# Patient Record
Sex: Male | Born: 1990 | Race: Black or African American | Hispanic: No | Marital: Single
Health system: Southern US, Community
[De-identification: ages and names within clinical notes are randomized; demographics above are authoritative.]

---
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
## 2017-11-17 ENCOUNTER — Emergency Department (HOSPITAL_COMMUNITY)
Admission: EM | Admit: 2017-11-17 | Discharge: 2017-11-17 | Disposition: A | Discharge status: Home / Self Care | Payer: Self-pay | Attending: Emergency Medicine | Admitting: Emergency Medicine

## 2017-11-17 ENCOUNTER — Encounter (HOSPITAL_COMMUNITY): Payer: Self-pay | Admitting: Emergency Medicine

## 2017-11-17 DIAGNOSIS — R1110 Vomiting, unspecified: Secondary | ICD-10-CM

## 2017-11-17 DIAGNOSIS — R197 Diarrhea, unspecified: Secondary | ICD-10-CM | POA: Insufficient documentation

## 2017-11-17 DIAGNOSIS — R112 Nausea with vomiting, unspecified: Secondary | ICD-10-CM | POA: Insufficient documentation

## 2017-11-17 LAB — CBC
HEMATOCRIT: 47.2 % (ref 39.0–52.0)
HEMOGLOBIN: 16.4 g/dL (ref 13.0–17.0)
MCH: 27.6 pg (ref 26.0–34.0)
MCHC: 34.7 g/dL (ref 30.0–36.0)
MCV: 79.3 fL (ref 78.0–100.0)
Platelets: 241 10*3/uL (ref 150–400)
RBC: 5.95 MIL/uL — ABNORMAL HIGH (ref 4.22–5.81)
RDW: 14.3 % (ref 11.5–15.5)
WBC: 12.6 10*3/uL — ABNORMAL HIGH (ref 4.0–10.5)

## 2017-11-17 LAB — URINALYSIS, ROUTINE W REFLEX MICROSCOPIC
BILIRUBIN URINE: NEGATIVE
GLUCOSE, UA: NEGATIVE mg/dL
HGB URINE DIPSTICK: NEGATIVE
Ketones, ur: 80 mg/dL — AB
Leukocytes, UA: NEGATIVE
Nitrite: NEGATIVE
PH: 5 (ref 5.0–8.0)
Protein, ur: NEGATIVE mg/dL
SPECIFIC GRAVITY, URINE: 1.021 (ref 1.005–1.030)

## 2017-11-17 LAB — COMPREHENSIVE METABOLIC PANEL
ALT: 23 U/L (ref 0–44)
ANION GAP: 13 (ref 5–15)
AST: 24 U/L (ref 15–41)
Albumin: 4.7 g/dL (ref 3.5–5.0)
Alkaline Phosphatase: 66 U/L (ref 38–126)
BUN: 11 mg/dL (ref 6–20)
CO2: 25 mmol/L (ref 22–32)
Calcium: 9.7 mg/dL (ref 8.9–10.3)
Chloride: 106 mmol/L (ref 98–111)
Creatinine, Ser: 0.93 mg/dL (ref 0.61–1.24)
GFR calc Af Amer: >60 mL/min (ref >60)
GFR calc non Af Amer: >60 mL/min (ref >60)
Glucose, Bld: 89 mg/dL (ref 70–99)
POTASSIUM: 3.4 mmol/L — AB (ref 3.5–5.1)
SODIUM: 144 mmol/L (ref 135–145)
Total Bilirubin: 1 mg/dL (ref 0.3–1.2)
Total Protein: 8.3 g/dL — ABNORMAL HIGH (ref 6.5–8.1)

## 2017-11-17 LAB — LIPASE, BLOOD: Lipase: 30 U/L (ref 11–51)

## 2017-11-17 MED ORDER — ONDANSETRON 4 MG PO TBDP
4.0000 mg | ORAL_TABLET | Freq: Once | ORAL | Status: AC
  Administered 2017-11-17: 4 mg via ORAL
  Filled 2017-11-17: qty 1

## 2017-11-17 MED ORDER — PROMETHAZINE HCL 25 MG/ML IJ SOLN
12.5000 mg | Freq: Once | INTRAMUSCULAR | Status: AC
  Administered 2017-11-17: 12.5 mg via INTRAVENOUS
  Filled 2017-11-17: qty 1

## 2017-11-17 MED ORDER — PROMETHAZINE HCL 25 MG/ML IJ SOLN
25.0000 mg | Freq: Once | INTRAMUSCULAR | Status: DC
  Filled 2017-11-17: qty 1

## 2017-11-17 MED ORDER — DICYCLOMINE HCL 10 MG PO CAPS
20.0000 mg | ORAL_CAPSULE | Freq: Once | ORAL | Status: AC
  Administered 2017-11-17: 20 mg via ORAL
  Filled 2017-11-17: qty 2

## 2017-11-17 MED ORDER — SODIUM CHLORIDE 0.9 % IV BOLUS
500.0000 mL | Freq: Once | INTRAVENOUS | Status: AC
  Administered 2017-11-17: 500 mL via INTRAVENOUS

## 2017-11-17 MED ORDER — SODIUM CHLORIDE 0.9 % IV BOLUS
1000.0000 mL | Freq: Once | INTRAVENOUS | Status: AC
  Administered 2017-11-17: 1000 mL via INTRAVENOUS

## 2017-11-17 MED ORDER — PROMETHAZINE HCL 25 MG/ML IJ SOLN: 12.5000 mg | Freq: Once | INTRAMUSCULAR | Status: DC

## 2017-11-17 MED ORDER — ONDANSETRON HCL 4 MG PO TABS: 4.0000 mg | ORAL_TABLET | Freq: Three times a day (TID) | ORAL | 0 refills | Status: DC | PRN

## 2017-11-17 NOTE — ED Triage Notes (Signed)
Pt c/o abd cramping started around 1300 with n/v. Reports ate left over Congohinese food today from last night.  Pt has 20g I left Forearm given Zofran 4mg  in route. EKG showed sinus brady. CBG 103 Vitals: 124/90, 56HR, 98% on RA, 16R.

## 2017-11-17 NOTE — Discharge Instructions (Signed)
You may take 1 dose of Zofran every 8 hours as needed for nausea and vomiting.  Please try to keep yourself hydrated over the next several days.   Please follow up with your primary doctor within the next 5-7 days.  If you do not have a primary care provider, information for a healthcare clinic has been provided for you to make arrangements for follow up care. Please return to the ER sooner if you have any new or worsening symptoms, or if you have any of the following symptoms:  Abdominal pain that does not go away.  You have a fever.  You keep throwing up (vomiting).  The pain is felt only in portions of the abdomen. Pain in the right side could possibly be appendicitis. In an adult, pain in the left lower portion of the abdomen could be colitis or diverticulitis.  You pass bloody or black tarry stools.  There is bright red blood in the stool.  The constipation stays for more than 4 days.  There is belly (abdominal) or rectal pain.  You do not seem to be getting better.  You have any questions or concerns.

## 2017-11-17 NOTE — ED Notes (Signed)
Bed: WLPT4 Expected date:  Expected time:  Means of arrival:  Comments: 

## 2017-11-17 NOTE — ED Provider Notes (Signed)
[facility]-EMERGENCY DEPT Provider Note   CSN: [id number] Arrival date & time: 11/17/17  1651     History   Chief Complaint Chief Complaint  Patient presents with  . Abdominal Cramping  . Emesis    HPI Seth Fuentes is a 27 y.o. male.  HPI  Patient Is a 27 year old male with no significant past medical history who presents emergency department today for evaluation of abdominal pain, nausea vomiting and diarrhea that began this afternoon.  Patient states that he had Cong[place]inese food last night and ate the leftovers this morning.  Shortly after eating this food he began to have abdominal pain to the periumbilical area that felt crampy in nature.  It was initially constant but after receiving Zofran with EMS pain came intermittent and improved.  Ports several episodes of vomiting and one episode of diarrhea.  No hematochezia or melena.  Reports that he thought he saw some specks of blood during his last episode of vomiting in the ED.  Also reports chills.  Denies any other symptoms.  Denies any sick contacts.  History reviewed. No pertinent past medical history.  There are no active problems to display for this patient.   History reviewed. No pertinent surgical history.      Home Medications    Prior to Admission medications   Medication Sig Start Date End Date Taking? Authorizing Provider  ondansetron (ZOFRAN) 4 MG tablet Take 1 tablet (4 mg total) by mouth every 8 (eight) hours as needed for nausea or vomiting. 11/17/17   Reonna Finlayson S, PA-C    Family History No family history on file.  Social History Social History   Tobacco Use  . Smoking status: Not on file  Substance Use Topics  . Alcohol use: Not on file  . Drug use: Not on file     Allergies   Patient has no known allergies.   Review of Systems Review of Systems  Constitutional: Positive for chills and fever.  HENT: Negative for ear pain and sore throat.   Eyes: Negative for pain  and visual disturbance.  Respiratory: Negative for cough and shortness of breath.   Cardiovascular: Negative for chest pain.  Gastrointestinal: Positive for abdominal pain, diarrhea, nausea and vomiting. Negative for blood in stool and constipation.  Genitourinary: Negative for dysuria, frequency, hematuria and urgency.  Musculoskeletal: Negative for back pain.  Skin: Negative for rash.  Neurological: Negative for headaches.  All other systems reviewed and are negative.  Physical Exam Updated Vital Signs BP (!) 108/54 (BP Location: Left Arm)   Pulse 80   Temp 98.2 F (36.8 C) (Oral)   Resp 16   SpO2 98%   Physical Exam  Constitutional: He appears well-developed.  Thin  HENT:  Head: Normocephalic and atraumatic.  Mouth/Throat: Oropharynx is clear and moist.  Eyes: Pupils are equal, round, and reactive to light. Conjunctivae and EOM are normal. No scleral icterus.  Neck: Neck supple.  Cardiovascular: Normal rate, regular rhythm, normal heart sounds and intact distal pulses.  No murmur heard. Pulmonary/Chest: Effort normal and breath sounds normal. No respiratory distress. He has no wheezes.  Abdominal: Soft. Bowel sounds are normal. He exhibits no distension. There is no tenderness. There is no guarding.  Musculoskeletal: He exhibits no edema.  Neurological: He is alert.  Skin: Skin is warm and dry.  Psychiatric: He has a normal mood and affect.  Nursing note and vitals reviewed.  ED Treatments / Results  Labs (all labs ordered are listed,  but only abnormal results are displayed) Labs Reviewed  COMPREHENSIVE METABOLIC PANEL - Abnormal; Notable for the following components:      Result Value   Potassium 3.4 (*)    Total Protein 8.3 (*)    All other components within normal limits  CBC - Abnormal; Notable for the following components:   WBC 12.6 (*)    RBC 5.95 (*)    All other components within normal limits  URINALYSIS, ROUTINE W REFLEX MICROSCOPIC - Abnormal; Notable  for the following components:   APPearance CLEAR (*)    Ketones, ur 80 (*)    All other components within normal limits  LIPASE, BLOOD    EKG None  Radiology No results found.  Procedures Procedures (including critical care time)  Medications Ordered in ED Medications  promethazine (PHENERGAN) injection 12.5 mg (12.5 mg Intravenous Given 11/17/17 1841)  sodium chloride 0.9 % bolus 1,000 mL (0 mLs Intravenous Stopped 11/17/17 2030)  dicyclomine (BENTYL) capsule 20 mg (20 mg Oral Given 11/17/17 1929)  sodium chloride 0.9 % bolus 500 mL (0 mLs Intravenous Stopped 11/17/17 2150)  ondansetron (ZOFRAN-ODT) disintegrating tablet 4 mg (4 mg Oral Given 11/17/17 2150)     Initial Impression / Assessment and Plan / ED Course  I have reviewed the triage vital signs and the nursing notes.  Pertinent labs & imaging results that were available during my care of the patient were reviewed by me and considered in my medical decision making (see chart for details).   Final Clinical Impressions(s) / ED Diagnoses   Final diagnoses:  Vomiting and diarrhea   Patient presented the ED today complaining of abdominal pain, nausea, vomiting, diarrhea that began earlier today.  He is afebrile here with normal vital signs.  Nontoxic and nonseptic appearing.  Exam is reassuring. Abdominal exam is benign without tenderness.  Has bowel sounds present in all 4 quadrants.    Recheck patient after Phenergan he states that his symptoms have improved.  He is now sleeping comfortably in bed.  Will give IV fluid bolus, Bentyl and reassess.  Will also obtain labs and UA.  Labs with a mild leukocytosis to 12.6.  Also has mild hypokalemia, but otherwise vitals are within normal limits.  Reevaluated patient after IV fluids, Bentyl and Phenergan.  He has had no further episodes of vomiting and states that his abdominal pain is somewhat improved after medications.  On reevaluation pain patient is sleeping comfortably in  bed.  Repeat abdominal exam is grossly benign without guarding or rebound tenderness.  After reevaluating patient, he had an additional episode of vomiting and diarrhea.  Will give additional Zofran and p.o. challenge the patient.  Reevaluated patient and he is sleeping comfortably in room in no acute distress.  Discussed the results of his lab work and urinalysis with him and his significant other at bedside.  Discussed the plan to discharge patient with symptomatic treatment and close follow-up with PCP advised to return to the ER for any new or worsening symptoms in the meantime.  Patient and significant other at bedside voiced understanding plan reasons return to the ED.  All questions answered  ED Discharge Orders         Ordered    ondansetron (ZOFRAN) 4 MG tablet  Every 8 hours PRN     11/17/17 2149           Karrie Meres, PA-C 11/17/17 2226    Mancel Bale, MD 11/18/17 0010

## 2017-11-20 ENCOUNTER — Encounter (HOSPITAL_COMMUNITY): Payer: Self-pay | Admitting: Emergency Medicine

## 2017-11-20 ENCOUNTER — Emergency Department (HOSPITAL_COMMUNITY)
Admission: EM | Admit: 2017-11-20 | Discharge: 2017-11-20 | Disposition: A | Discharge status: Home / Self Care | Payer: Self-pay | Attending: Emergency Medicine | Admitting: Emergency Medicine

## 2017-11-20 ENCOUNTER — Emergency Department (HOSPITAL_COMMUNITY): Payer: Self-pay

## 2017-11-20 ENCOUNTER — Other Ambulatory Visit: Payer: Self-pay

## 2017-11-20 DIAGNOSIS — F17210 Nicotine dependence, cigarettes, uncomplicated: Secondary | ICD-10-CM | POA: Insufficient documentation

## 2017-11-20 DIAGNOSIS — Z79899 Other long term (current) drug therapy: Secondary | ICD-10-CM | POA: Insufficient documentation

## 2017-11-20 DIAGNOSIS — K529 Noninfective gastroenteritis and colitis, unspecified: Secondary | ICD-10-CM | POA: Insufficient documentation

## 2017-11-20 LAB — CBC
HCT: 46.5 % (ref 39.0–52.0)
HEMOGLOBIN: 15.2 g/dL (ref 13.0–17.0)
MCH: 26.8 pg (ref 26.0–34.0)
MCHC: 32.7 g/dL (ref 30.0–36.0)
MCV: 81.9 fL (ref 78.0–100.0)
Platelets: 242 10*3/uL (ref 150–400)
RBC: 5.68 MIL/uL (ref 4.22–5.81)
RDW: 14.1 % (ref 11.5–15.5)
WBC: 7.9 10*3/uL (ref 4.0–10.5)

## 2017-11-20 LAB — COMPREHENSIVE METABOLIC PANEL
ALK PHOS: 54 U/L (ref 38–126)
ALT: 18 U/L (ref 0–44)
ANION GAP: 8 (ref 5–15)
AST: 17 U/L (ref 15–41)
Albumin: 4.2 g/dL (ref 3.5–5.0)
BILIRUBIN TOTAL: 0.6 mg/dL (ref 0.3–1.2)
BUN: 10 mg/dL (ref 6–20)
CALCIUM: 9.6 mg/dL (ref 8.9–10.3)
CO2: 26 mmol/L (ref 22–32)
Chloride: 106 mmol/L (ref 98–111)
Creatinine, Ser: 1.03 mg/dL (ref 0.61–1.24)
GFR calc non Af Amer: >60 mL/min (ref >60)
Glucose, Bld: 106 mg/dL — ABNORMAL HIGH (ref 70–99)
Potassium: 3.4 mmol/L — ABNORMAL LOW (ref 3.5–5.1)
SODIUM: 140 mmol/L (ref 135–145)
TOTAL PROTEIN: 7.5 g/dL (ref 6.5–8.1)

## 2017-11-20 LAB — LIPASE, BLOOD: Lipase: 68 U/L — ABNORMAL HIGH (ref 11–51)

## 2017-11-20 MED ORDER — IOPAMIDOL (ISOVUE-300) INJECTION 61%
INTRAVENOUS | Status: AC
  Administered 2017-11-20: 100 mL
  Filled 2017-11-20: qty 100

## 2017-11-20 MED ORDER — PROMETHAZINE HCL 25 MG PO TABS: 25.0000 mg | ORAL_TABLET | Freq: Four times a day (QID) | ORAL | 0 refills | Status: DC | PRN

## 2017-11-20 MED ORDER — DIPHENOXYLATE-ATROPINE 2.5-0.025 MG PO TABS: 2.0000 | ORAL_TABLET | Freq: Four times a day (QID) | ORAL | 0 refills | Status: DC | PRN

## 2017-11-20 MED ORDER — DICYCLOMINE HCL 20 MG PO TABS: 20.0000 mg | ORAL_TABLET | Freq: Two times a day (BID) | ORAL | 0 refills | Status: DC

## 2017-11-20 NOTE — ED Notes (Signed)
Pt called for room x1

## 2017-11-20 NOTE — ED Provider Notes (Signed)
[facility]ENC[facility] Provider Note   CSN: [id number] Arrival date & time: 11/20/17  0235     History   Chief Complaint Chief Complaint  Patient presents with  . Abdominal Pain    HPI Seth Fuentes is a 27 y.o. male.  Patient presents to the emergency department for evaluation of abdominal pain.  Patient was [facility] ER [facility] days ago for same.  Patient reports that he is continuing to have diffuse abdominal pain, nausea, vomiting and diarrhea.     History reviewed. No pertinent past medical history.  There are no active problems to display for this patient.   History reviewed. No pertinent surgical history.      Home Medications    Prior to Admission medications   Medication Sig Start Date End Date Taking? Authorizing Provider  dicyclomine (BENTYL) 20 MG tablet Take 1 tablet (20 mg total) by mouth 2 (two) times daily. 11/20/17   Gilda CreasePollina, Christopher J, MD  diphenoxylate-atropine (LOMOTIL) 2.5-0.025 MG tablet Take 2 tablets by mouth 4 (four) times daily as needed for diarrhea or loose stools. 11/20/17   Gilda CreasePollina, Christopher J, MD  ondansetron (ZOFRAN) 4 MG tablet Take 1 tablet (4 mg total) by mouth every 8 (eight) hours as needed for nausea or vomiting. Patient not taking: Reported on 11/20/2017 11/17/17   Couture, Cortni S, PA-C  promethazine (PHENERGAN) 25 MG tablet Take 1 tablet (25 mg total) by mouth every 6 (six) hours as needed for nausea or vomiting. 11/20/17   Pollina, Canary Brimhristopher J, MD    Family History No family history on file.  Social History Social History   Tobacco Use  . Smoking status: Current Every Day Smoker  . Smokeless tobacco: Never Used  Substance Use Topics  . Alcohol use: Not Currently  . Drug use: Not Currently     Allergies   Patient has no known allergies.   Review of Systems Review of Systems  Gastrointestinal: Positive for abdominal pain, diarrhea, nausea and vomiting.  All other  systems reviewed and are negative.    Physical Exam Updated Vital Signs BP 115/74 (BP Location: Right Arm)   Pulse 63   Temp 98 F (36.7 C) (Oral)   Resp 16   Ht 6\' 1"  (1.854 m)   Wt 63.5 kg   SpO2 100%   BMI 18.47 kg/m   Physical Exam  Constitutional: He is oriented to person, place, and time. He appears well-developed and well-nourished. No distress.  HENT:  Head: Normocephalic and atraumatic.  Right Ear: Hearing normal.  Left Ear: Hearing normal.  Nose: Nose normal.  Mouth/Throat: Oropharynx is clear and moist and mucous membranes are normal.  Eyes: Pupils are equal, round, and reactive to light. Conjunctivae and EOM are normal.  Neck: Normal range of motion. Neck supple.  Cardiovascular: Regular rhythm, S1 normal and S2 normal. Exam reveals no gallop and no friction rub.  No murmur heard. Pulmonary/Chest: Effort normal and breath sounds normal. No respiratory distress. He exhibits no tenderness.  Abdominal: Soft. Normal appearance and bowel sounds are normal. There is no hepatosplenomegaly. There is generalized tenderness. There is no rebound, no guarding, no tenderness at McBurney's point and negative Murphy's sign. No hernia.  Musculoskeletal: Normal range of motion.  Neurological: He is alert and oriented to person, place, and time. He has normal strength. No cranial nerve deficit or sensory deficit. Coordination normal. GCS eye subscore is 4. GCS verbal subscore is 5. GCS motor subscore is  6.  Skin: Skin is warm, dry and intact. No rash noted. No cyanosis.  Psychiatric: He has a normal mood and affect. His speech is normal and behavior is normal. Thought content normal.  Nursing note and vitals reviewed.    ED Treatments / Results  Labs (all labs ordered are listed, but only abnormal results are displayed) Labs Reviewed  LIPASE, BLOOD - Abnormal; Notable for the following components:      Result Value   Lipase 68 (*)    All other components within normal limits    COMPREHENSIVE METABOLIC PANEL - Abnormal; Notable for the following components:   Potassium 3.4 (*)    Glucose, Bld 106 (*)    All other components within normal limits  CBC  URINALYSIS, ROUTINE W REFLEX MICROSCOPIC    EKG None  Radiology Ct Abdomen Pelvis W Contrast  Result Date: 11/20/2017 CLINICAL DATA:  Diffuse abdominal pain with nausea and vomiting for 2 days. EXAM: CT ABDOMEN AND PELVIS WITH CONTRAST TECHNIQUE: Multidetector CT imaging of the abdomen and pelvis was performed using the standard protocol following bolus administration of intravenous contrast. CONTRAST:  ISOVUE-300 IOPAMIDOL (ISOVUE-300) INJECTION 61% COMPARISON:  None. FINDINGS: Lower chest: Clear lung bases.  The heart is normal in size. Hepatobiliary: No focal liver abnormality is seen. No gallstones, gallbladder wall thickening, or biliary dilatation. Pancreas: Unremarkable. No pancreatic ductal dilatation or surrounding inflammatory changes. Spleen: Normal in size without focal abnormality. Adrenals/Urinary Tract: Adrenal glands are unremarkable. Kidneys are normal, without renal calculi, focal lesion, or hydronephrosis. Bladder is unremarkable. Stomach/Bowel: Stomach is unremarkable. There are prominent loops of small bowel with air-fluid levels. There is no wall thickening or adjacent inflammation. Fluid is noted in the rectum and colon. No colonic dilation, wall thickening or inflammation. Normal appendix visualized. Vascular/Lymphatic: No significant vascular findings are present. No enlarged abdominal or pelvic lymph nodes. Reproductive: Unremarkable. Other: No abdominal wall hernia or abnormality. No abdominopelvic ascites. Musculoskeletal: No acute or significant osseous findings. IMPRESSION: 1. Prominent small bowel loops. Small bowel air-fluid levels and fluid in the colon and rectum. No bowel wall thickening or adjacent inflammation. Findings are consistent with gastroenteritis. No evidence of bowel  obstruction. 2. No other abnormalities.  Normal appendix visualized. Electronically Signed   By: Amie Portland M.D.   On: 11/20/2017 07:53    Procedures Procedures (including critical care time)  Medications Ordered in ED Medications  iopamidol (ISOVUE-300) 61 % injection (100 mLs  Contrast Given 11/20/17 0545)     Initial Impression / Assessment and Plan / ED Course  I have reviewed the triage vital signs and the nursing notes.  Pertinent labs & imaging results that were available during my care of the patient were reviewed by me and considered in my medical decision making (see chart for details).     Patient presents to the ER for the second time with complaints of abdominal pain, nausea, vomiting and diarrhea.  Lab work was unremarkable.  Exam revealed diffuse tenderness but no guarding or rebound.  Because he is having persistent symptoms and has returned to the ER, CT scan was performed.  Patient does have prominent small bowel loops throughout his abdomen but no evidence of obstruction.  This is most consistent with a gastroenteritis.  Patient appears to be comfortable, sleeping in the room on recheck.  Will discharge with additional supportive care.  Final Clinical Impressions(s) / ED Diagnoses   Final diagnoses:  Gastroenteritis    ED Discharge Orders  Ordered    dicyclomine (BENTYL) 20 MG tablet  2 times daily     11/20/17 0804    promethazine (PHENERGAN) 25 MG tablet  Every 6 hours PRN     11/20/17 0804    diphenoxylate-atropine (LOMOTIL) 2.5-0.025 MG tablet  4 times daily PRN     11/20/17 0804           Gilda Crease, MD 11/20/17 [phone]

## 2017-11-20 NOTE — ED Triage Notes (Signed)
Pt reports sharp generalized  abd pain x 2 days with nausea, vomiting, and diarrhea.  States [facility] and symptoms haven't improved.

## 2017-11-20 NOTE — ED Notes (Signed)
Patient transported to CT 

## 2017-11-20 NOTE — ED Notes (Signed)
Pt Unable to void at this time.

## 2018-12-11 ENCOUNTER — Emergency Department (HOSPITAL_COMMUNITY)
Admission: EM | Admit: 2018-12-11 | Discharge: 2018-12-11 | Disposition: A | Discharge status: Home / Self Care | Payer: Self-pay | Attending: Emergency Medicine | Admitting: Emergency Medicine

## 2018-12-11 ENCOUNTER — Other Ambulatory Visit: Payer: Self-pay

## 2018-12-11 ENCOUNTER — Encounter (HOSPITAL_COMMUNITY): Payer: Self-pay | Admitting: Emergency Medicine

## 2018-12-11 DIAGNOSIS — Z5321 Procedure and treatment not carried out due to patient leaving prior to being seen by health care provider: Secondary | ICD-10-CM | POA: Insufficient documentation

## 2018-12-11 DIAGNOSIS — H5711 Ocular pain, right eye: Secondary | ICD-10-CM | POA: Insufficient documentation

## 2018-12-11 NOTE — ED Notes (Signed)
No answer x2 

## 2018-12-11 NOTE — ED Notes (Signed)
Called x 2 no answer

## 2018-12-11 NOTE — ED Triage Notes (Signed)
Pt c/o right eye pain and redness, pt states he has been seen for the same and given drops with no relief.

## 2019-01-12 ENCOUNTER — Other Ambulatory Visit: Payer: Self-pay

## 2019-01-12 ENCOUNTER — Emergency Department (HOSPITAL_COMMUNITY): Payer: Self-pay

## 2019-01-12 ENCOUNTER — Emergency Department (HOSPITAL_COMMUNITY)
Admission: EM | Admit: 2019-01-12 | Discharge: 2019-01-12 | Disposition: A | Discharge status: Home / Self Care | Payer: Self-pay | Attending: Emergency Medicine | Admitting: Emergency Medicine

## 2019-01-12 DIAGNOSIS — F1910 Other psychoactive substance abuse, uncomplicated: Secondary | ICD-10-CM | POA: Insufficient documentation

## 2019-01-12 DIAGNOSIS — R404 Transient alteration of awareness: Secondary | ICD-10-CM | POA: Insufficient documentation

## 2019-01-12 LAB — COMPREHENSIVE METABOLIC PANEL
ALT: 28 U/L (ref 0–44)
AST: 37 U/L (ref 15–41)
Albumin: 4.4 g/dL (ref 3.5–5.0)
Alkaline Phosphatase: 68 U/L (ref 38–126)
Anion gap: 18 — ABNORMAL HIGH (ref 5–15)
BUN: 10 mg/dL (ref 6–20)
CO2: 23 mmol/L (ref 22–32)
Calcium: 9.6 mg/dL (ref 8.9–10.3)
Chloride: 103 mmol/L (ref 98–111)
Creatinine, Ser: 1.42 mg/dL — ABNORMAL HIGH (ref 0.61–1.24)
GFR calc Af Amer: >60 mL/min (ref >60)
GFR calc non Af Amer: >60 mL/min (ref >60)
Glucose, Bld: 141 mg/dL — ABNORMAL HIGH (ref 70–99)
Potassium: 3.8 mmol/L (ref 3.5–5.1)
Sodium: 144 mmol/L (ref 135–145)
Total Bilirubin: 0.5 mg/dL (ref 0.3–1.2)
Total Protein: 7.7 g/dL (ref 6.5–8.1)

## 2019-01-12 LAB — SALICYLATE LEVEL: Salicylate Lvl: <7 mg/dL (ref 2.8–30.0)

## 2019-01-12 LAB — CBC WITH DIFFERENTIAL/PLATELET
Abs Immature Granulocytes: 0.16 10*3/uL — ABNORMAL HIGH (ref 0.00–0.07)
Basophils Absolute: 0.1 10*3/uL (ref 0.0–0.1)
Basophils Relative: 0 %
Eosinophils Absolute: 0 10*3/uL (ref 0.0–0.5)
Eosinophils Relative: 0 %
HCT: 51.8 % (ref 39.0–52.0)
Hemoglobin: 16.2 g/dL (ref 13.0–17.0)
Immature Granulocytes: 1 %
Lymphocytes Relative: 6 %
Lymphs Abs: 1.3 10*3/uL (ref 0.7–4.0)
MCH: 27.9 pg (ref 26.0–34.0)
MCHC: 31.3 g/dL (ref 30.0–36.0)
MCV: 89.2 fL (ref 80.0–100.0)
Monocytes Absolute: 1.9 10*3/uL — ABNORMAL HIGH (ref 0.1–1.0)
Monocytes Relative: 8 %
Neutro Abs: 19.1 10*3/uL — ABNORMAL HIGH (ref 1.7–7.7)
Neutrophils Relative %: 85 %
Platelets: 191 10*3/uL (ref 150–400)
RBC: 5.81 MIL/uL (ref 4.22–5.81)
RDW: 14.6 % (ref 11.5–15.5)
WBC: 22.5 10*3/uL — ABNORMAL HIGH (ref 4.0–10.5)
nRBC: 0 % (ref 0.0–0.2)

## 2019-01-12 LAB — RAPID URINE DRUG SCREEN, HOSP PERFORMED
Amphetamines: NOT DETECTED
Barbiturates: NOT DETECTED
Benzodiazepines: NOT DETECTED
Cocaine: NOT DETECTED
Opiates: POSITIVE — AB
Tetrahydrocannabinol: POSITIVE — AB

## 2019-01-12 LAB — ACETAMINOPHEN LEVEL: Acetaminophen (Tylenol), Serum: <10 ug/mL — ABNORMAL LOW (ref 10–30)

## 2019-01-12 LAB — ETHANOL: Alcohol, Ethyl (B): <10 mg/dL (ref <10)

## 2019-01-12 LAB — CK: Total CK: 825 U/L — ABNORMAL HIGH (ref 49–397)

## 2019-01-12 MED ORDER — SODIUM CHLORIDE 0.9 % IV BOLUS (SEPSIS)
1000.0000 mL | Freq: Once | INTRAVENOUS | Status: AC
  Administered 2019-01-12: 03:00:00 1000 mL via INTRAVENOUS

## 2019-01-12 MED ORDER — SODIUM CHLORIDE 0.9 % IV BOLUS (SEPSIS)
1000.0000 mL | Freq: Once | INTRAVENOUS | Status: AC
  Administered 2019-01-12: 01:00:00 1000 mL via INTRAVENOUS

## 2019-01-12 MED ORDER — ONDANSETRON HCL 4 MG/2ML IJ SOLN
4.0000 mg | Freq: Once | INTRAMUSCULAR | Status: AC
  Administered 2019-01-12: 4 mg via INTRAVENOUS
  Filled 2019-01-12: qty 2

## 2019-01-12 NOTE — ED Notes (Signed)
Patient verbalizes understanding of discharge instructions. Opportunity for questioning and answers were provided. Armband removed by staff, pt discharged from ED.  

## 2019-01-12 NOTE — ED Provider Notes (Signed)
[facility] EMERGENCY DEPARTMENT Provider Note   CSN: [id number] Arrival date & time: 01/12/19  0047     History   Chief Complaint Chief Complaint  Patient presents with  . Altered Mental Status   Level 5 caveat due to altered mental status HPI Seth Fuentes is a 28 y.o. male.     HPI  Patient presents from home via EMS for altered level of consciousness.  Patient's mother found him unresponsive with a marijuana joint in his mouth.  EMS was called and he started to wake up and was nonverbal.  He appeared anxious and had pinpoint pupils.  He was also noted to be tachycardic.  No Narcan was given.  No other details are known at this time  PMH-unknown Soc hx - unknown Home Medications    Prior to Admission medications   Not on File    Family History No family history on file.  Social History Social History   Tobacco Use  . Smoking status: Not on file  Substance Use Topics  . Alcohol use: Not on file  . Drug use: Not on file     Allergies   Patient has no allergy information on record.   Review of Systems Review of Systems  Unable to perform ROS: Mental status change     Physical Exam Updated Vital Signs Pulse (!) 110   Resp (!) 22   Ht 1.905 m (6\' 3" )   Wt 86.2 kg   SpO2 97%   BMI 23.75 kg/m   Physical Exam CONSTITUTIONAL: Disheveled HEAD: Normocephalic/atraumatic EYES: Pupils pinpoint, no nystagmus ENMT: Mucous membranes moist NECK: supple no meningeal signs SPINE/BACK:entire spine nontender CV: Tachycardic, no loud murmurs LUNGS: Lungs are clear to auscultation bilaterally, no apparent distress ABDOMEN: soft, nontender NEURO: Pt is awake/alert but is nonverbal.  He does follow some commands.  He moves all extremities x4 EXTREMITIES: pulses normal/equal, full ROM, no deformities SKIN: warm, color normal PSYCH: Patient appears anxious  ED Treatments / Results  Labs (all labs ordered are listed, but only abnormal results  are displayed) Labs Reviewed  CBC WITH DIFFERENTIAL/PLATELET - Abnormal; Notable for the following components:      Result Value   WBC 22.5 (*)    Neutro Abs 19.1 (*)    Monocytes Absolute 1.9 (*)    Abs Immature Granulocytes 0.16 (*)    All other components within normal limits  COMPREHENSIVE METABOLIC PANEL - Abnormal; Notable for the following components:   Glucose, Bld 141 (*)    Creatinine, Ser 1.42 (*)    Anion gap 18 (*)    All other components within normal limits  ACETAMINOPHEN LEVEL - Abnormal; Notable for the following components:   Acetaminophen (Tylenol), Serum <10 (*)    All other components within normal limits  RAPID URINE DRUG SCREEN, HOSP PERFORMED - Abnormal; Notable for the following components:   Opiates POSITIVE (*)    Tetrahydrocannabinol POSITIVE (*)    All other components within normal limits  CK - Abnormal; Notable for the following components:   Total CK 825 (*)    All other components within normal limits  ETHANOL  SALICYLATE LEVEL    EKG  ED ECG REPORT   Date: 01/12/2019 0055  Rate: 104  Rhythm: sinus tachycardia  QRS Axis: right  Intervals: normal  ST/T Wave abnormalities: normal  Conduction Disutrbances:none   I have personally reviewed the EKG tracing and agree with the computerized printout as noted.  Radiology Dg Chest [facility]  1 View  Result Date: 01/12/2019 CLINICAL DATA:  Unresponsive. EXAM: PORTABLE CHEST 1 VIEW COMPARISON:  None. FINDINGS: The cardiomediastinal contours are normal. Mild left perihilar atelectasis. Pulmonary vasculature is normal. No consolidation, pleural effusion, or pneumothorax. No acute osseous abnormalities are seen. IMPRESSION: Mild left perihilar atelectasis. Electronically Signed   By: Keith Rake M.D.   On: 01/12/2019 02:42    Procedures Procedures   Medications Ordered in ED Medications  ondansetron (ZOFRAN) injection 4 mg (has no administration in time range)  sodium chloride 0.9 % bolus 1,000  mL (has no administration in time range)  sodium chloride 0.9 % bolus 1,000 mL (0 mLs Intravenous Stopped 01/12/19 0228)     Initial Impression / Assessment and Plan / ED Course  I have reviewed the triage vital signs and the nursing notes.  Pertinent labs & imaging results that were available during my care of the patient were reviewed by me and considered in my medical decision making (see chart for details).        12:59 AM Patient presents from home for unresponsiveness.  Apparently he was found smoking marijuana and was unresponsive.  Suspect this is all substance abuse induced.  Labs are pending at this time 2:01 AM Mother arrived and reports that when she came home from work patient appeared unresponsive with bloody sputum beside his mouth. She last saw him when she went to work at approximately 2 PM earlier in the day.  He was at his baseline.  He has no other health problems. Patient is still alert but is nonverbal.  Work-up is pending at this time We will add on chest x-ray. BP 100/64   Pulse (!) 102   Temp 97.6 F (36.4 C) (Oral)   Resp (!) 24   Ht 1.905 m (6\' 3" )   Wt 86.2 kg   SpO2 96%   BMI 23.75 kg/m  3:22 AM Patient more alert and verbal, but now vomiting.  Will give IV fluids and Zofran 5:40 AM Patient is now improved.  He is awake alert taking fluids.  Vitals are improved He walked around the department without difficulty.  He reports he is unsure what happened tonight.  He admits to marijuana use, but is unsure how the opiates got in his urine drug screen Patient was counseled on substance abuse.  Patient gave me permission to call his mother and she was informed of plan.  Suspect alteration in mental status tonight was due to substance abuse.  He appears back to baseline and is appropriate for discharge home  Final Clinical Impressions(s) / ED Diagnoses   Final diagnoses:  Transient alteration of awareness  Substance abuse [facility])    ED Discharge Orders     None       Ripley Fraise, MD 01/12/19 [phone]

## 2019-01-12 NOTE — ED Notes (Signed)
Pt refusing to have rectal temperature. Oral taken at 37.6.

## 2019-01-12 NOTE — Discharge Instructions (Signed)
Substance Abuse Treatment Programs ° °Intensive Outpatient Programs °[facility]     °[address]      °[place], [place]                   °[phone]      ° °[facility] °[address] °[place], [place] °[phone] ° °[facility]     °(Inpatient and outpatient)     °[address]           °[phone]   ° °[facility] °336-288-1484 (Suboxone and Methadone) ° °[address]      °[place], [place] [address]      °336-882-2125      ° °[address] °[place], [place] °852-3033 ° °[facility] (Outpatient/Inpatient, Chemical)    °(insurance only) [phone]      °       °[facility] (Groups & Residential) °[place], [place] °336-389-1413 ° °   °[facility]     °[address]     °[place], [place]      °[phone]      ° °[facility] (for caregivers and family) °[address] °[place], [place] °336-299-4655 ° ° ° ° ° °Residential Treatment Programs °[facility]      °[address], [place], [place] [address]  °([phone]      ° °[facility] °[address] [place], [place] [address] °[phone] ° °[facility]        °336-248-8914      ° °[facility] °1-800-659-3381 ° °[facility]             °[address]                                         °[place], [place]                                                °877-615-2722 or 336-784-9470                              ° °[facility] [place] °[address] °[place], [address] °1.877.941.8954 ° °[facility]    °[address]      °[place], [place]     °[phone]      ° °[facility] °[address] °[place], [place] °336-285-9073 ° °[facility]   °[address]     °[place], [place] [address]     °[phone]      °Admissions: 8am-3pm M-F ° °[facility] °[address] °[place],  [place] °336-227-7417 ° °[facility]: Residential Program (90 Days)   °[place], [place]      °336-725-8389 or 800-758-6077    ° °[facility] °[place],  °(Walk in Hours over the weekend or by referral) ° °[facility] °[address], [place],  [address] °(336) [phone] ° °Crisis Mobile: [facility]:  1-877-626-1772 (for crisis response 24 hours a day) °[facility] Hotline:      1-800-256-2452 °Outpatient Psychiatry and Counseling ° °[facility]: Mobile Crisis   Management 24 hours:  1-877-626-1772 ° °[facility] sliding scale fee and walk in schedule: M-F 8am-12pm/1pm-3pm °[address]  °[place], [place] [address] °[phone] ° °[facility] °[address] °[place], [place] [address] °336-703-9650 ° °[facility] ([facility] known [facility])- new patient walk-in appointments available Monday - Friday 8am -3pm.          °[address] °[place], [place] [address] °336-676-6840 or crisis line- 336-676-6905 ° °[facility] Intensive Outpatient Therapy Program °[address] °[place], [place] [address] °336-832-9804 ° °[facility]                  °Crisis Services      °[phone]      °[address]     °[place], [place] [address]                ° °[facility]   °[facility] °[phone] °[address] °[place], [place] [address] ° ° °[facility]          °[address],  °[place], [place] [address]       °([phone] ° °[facility] °[address] °[place], [place] [address] °336-292-1510 ° °[facility]    °[address]    °[place], [place] [address]     °[phone]      ° °Parish McKinney, MD     °[address]     °[place] [address]     °336-282-1251     °  °[facility] °[address] °[place] [address] ° °[facility]     °[address]     °[place], [place]      °336-542-2076      ° °[facility] °Shamsher Ahluwalia, MD °[address] °[place], [place] [address] °[phone] ° °[facility]     °[address]     °[place], [place] [address]     °336-274-1237      ° °[facility] °[address] °[place], [place] [address] °336-854-4450 °Resources for Temporary Residential Assistance/Crisis Centers ° °DAY CENTERS °[facility] °M-F 8am-3pm   °[address] [place], [place] [address]   336-332-0824 °Services include: laundry, barbering, support groups, case management, phone  & computer access, showers, AA/NA mtgs, mental health/substance abuse nurse, job skills class, disability information, VA assistance, spiritual classes, etc.  ° °HOMELESS SHELTERS ° °[facility]     °[facility]   °[address], [place]     °[phone]       °       °[facility] (women and children)       °[address] °[place], [place] [address] °336-275-0820 °[email or web address] for application and process °Application Required ° °[facility]   °[address]    °[place] [address]     °[phone]       °             °[facility] °[address] °[place], [place] [address] °336.273.5572 °336-235-0363(schedule application appt.) °Application Required ° °[facility] (women only)    °[address]     °[place],  [address]     °336-884-1039      °  Intake starts 6pm daily °Need valid ID, SSC, & Police report °[facility] [place] °[address] °[place], [place] °336-881-5420 °Application Required ° °[facility] (men only)     °[address]      °[place], [place]     °[phone]      ° °[facility] °(Pregnant women only) °[address] °[place], [place] °336-275-0206 ° °[facility]      °[address]      °[place], [place] [address]     °[phone]      °       °[facility] °[address] °[place], [place] °336-723-1848 °90 day commitment/SA/Application process ° °[facility](men only)     °[address]     °[place], [place]     °[phone]       °Check-in at 7pm     °       °[facility] °[address] °[place], [place] [address] °336-248-6684 °Men/Women/Women and Children must be there by 7 pm ° °[facility] °[place], [place] °336-722-8721                ° °

## 2019-01-12 NOTE — ED Notes (Signed)
Pt ambulated with strong gait  

## 2019-01-12 NOTE — ED Triage Notes (Signed)
Pt arrived via [facility], c/o illicit substance use altered level of conciousness/AMS after mother found him unresponsive . VS: HR 130, SPO2 98%, BGL 202. Pt is nonverbal at this time, responsive to verbal stimuli. Anxious. Pinpoint pupils.

## 2019-12-03 IMAGING — DX DG CHEST 1V PORT
1 series · 1 of 1 positions shown · non-contrast
Comparison: None.

CLINICAL DATA: Unresponsive.

EXAM:
PORTABLE CHEST 1 VIEW

[chest]
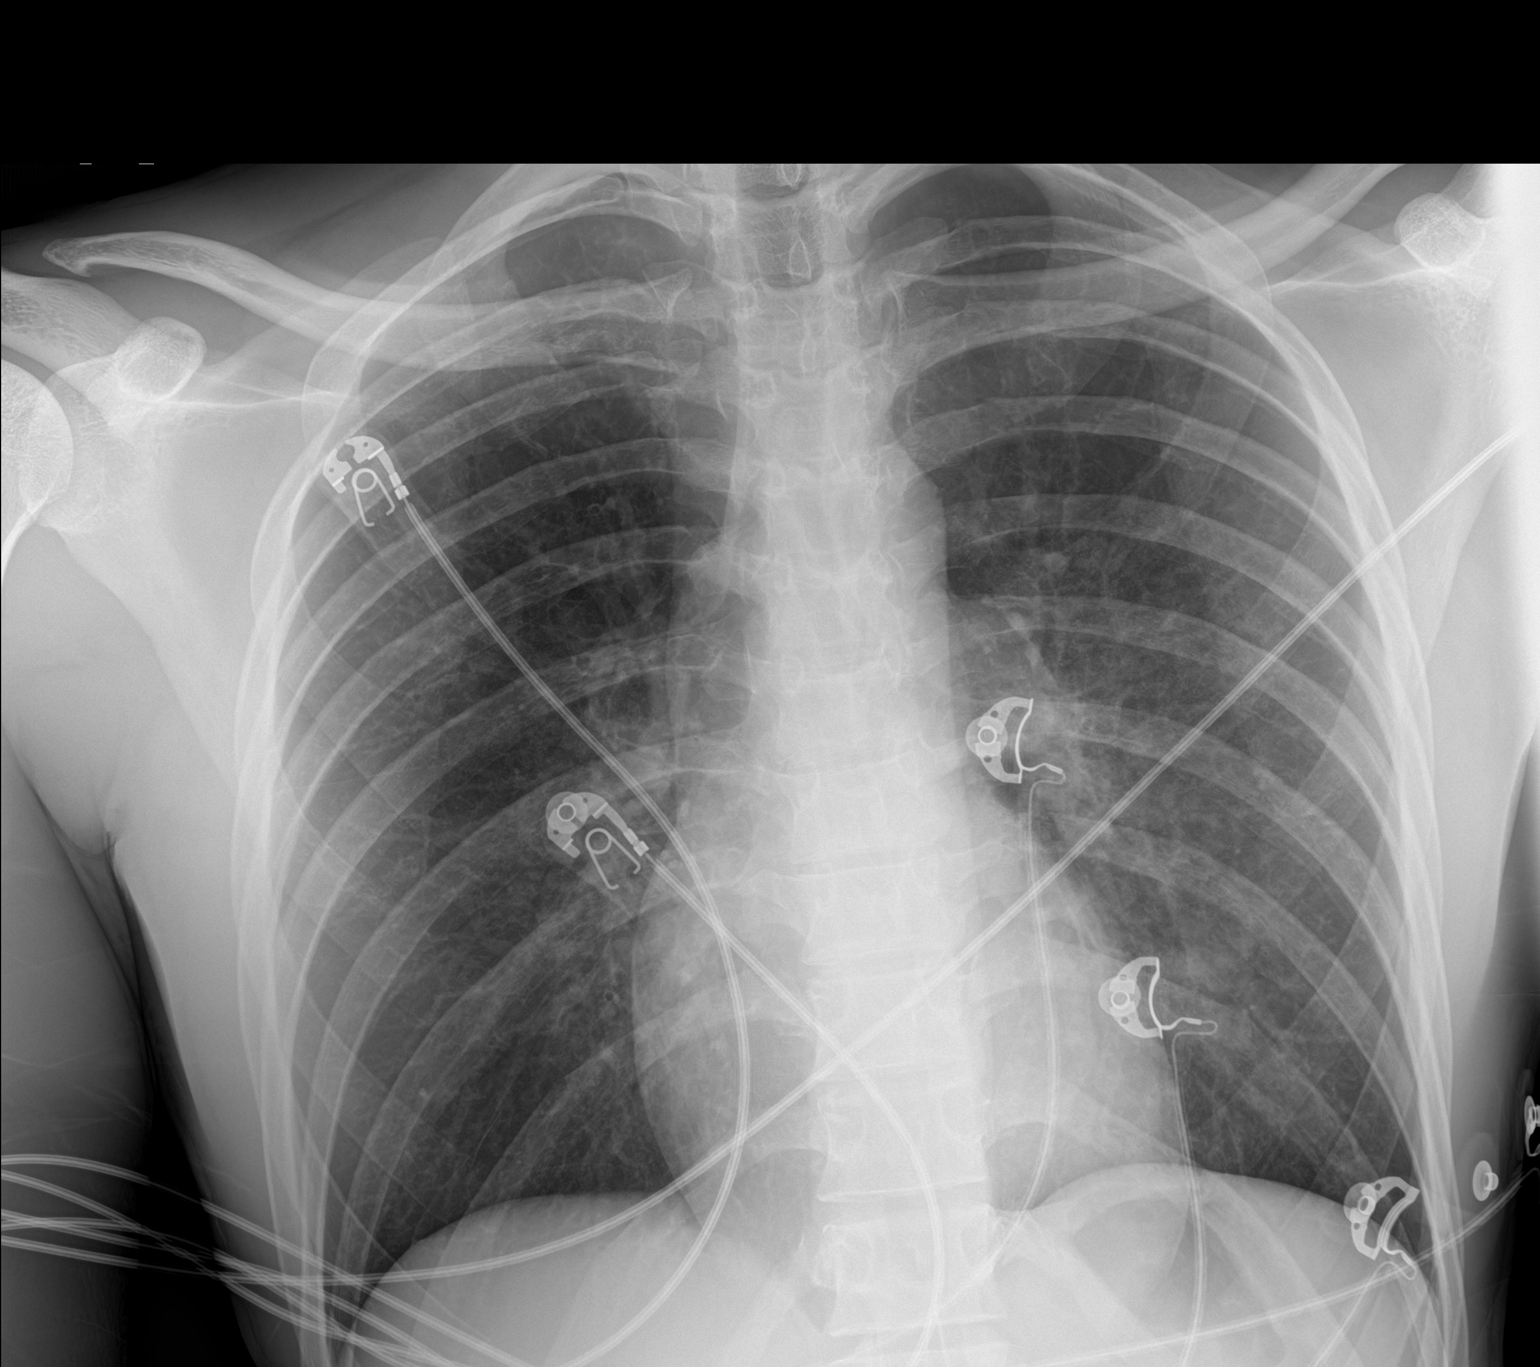

[1 of 1 positions shown; findings below may reference images not displayed]

FINDINGS: The cardiomediastinal contours are normal. Mild left perihilar
atelectasis. Pulmonary vasculature is normal. No consolidation,
pleural effusion, or pneumothorax. No acute osseous abnormalities
are seen.
IMPRESSION: Mild left perihilar atelectasis.

## 2019-12-14 IMAGING — CT CT ABD-PELV W/ CM
2 of 4 series · 16 of 46 positions shown, 18 images · IV contrast (APPLIED)
Comparison: None.

CLINICAL DATA: Diffuse abdominal pain with nausea and vomiting for
2 days.

EXAM:
CT ABDOMEN AND PELVIS WITH CONTRAST
TECHNIQUE: Multidetector CT imaging of the abdomen and pelvis was performed
using the standard protocol following bolus administration of
intravenous contrast.
CONTRAST:  100mL QEO9BQ-LHH IOPAMIDOL ([id number]-LHH) INJECTION 61%

[Series 3: abdomen 5.0 · axial · 0.66mm/px · z∈[-447,-47]mm · 13 of 92 slices shown, 15 images]
[im 6/92  soft-tissue]
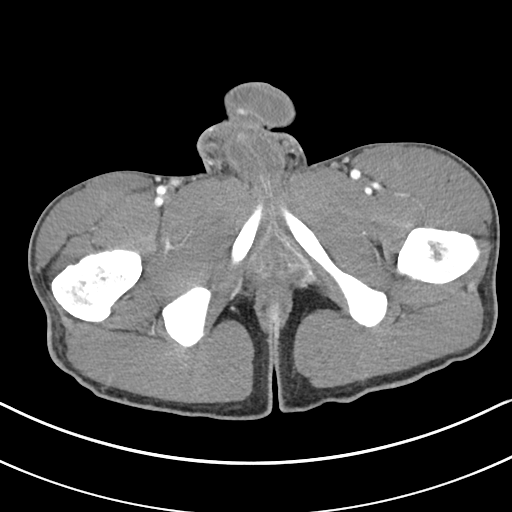
[im 6/92  bone]
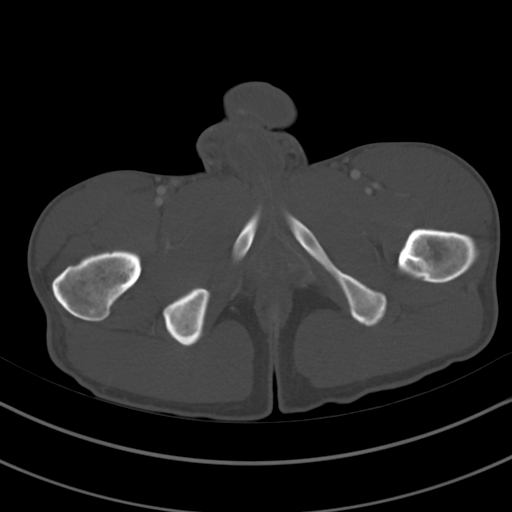
[im 11/92  soft-tissue]
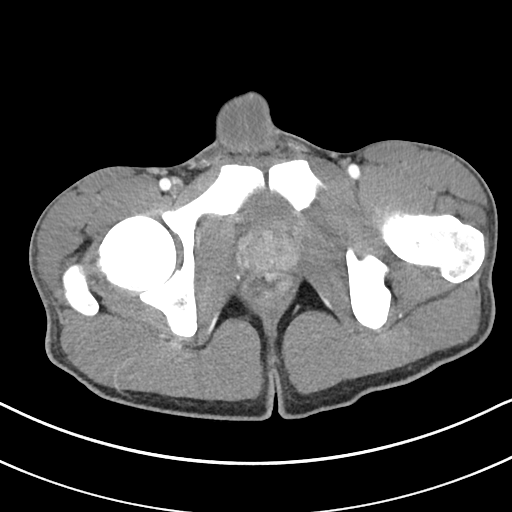
[im 21/92  soft-tissue]
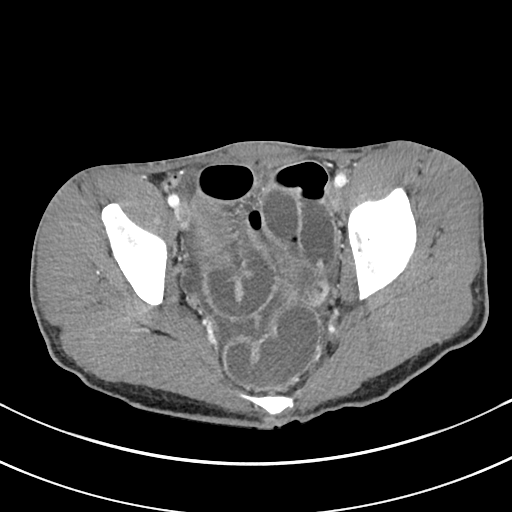
[im 26/92  soft-tissue]
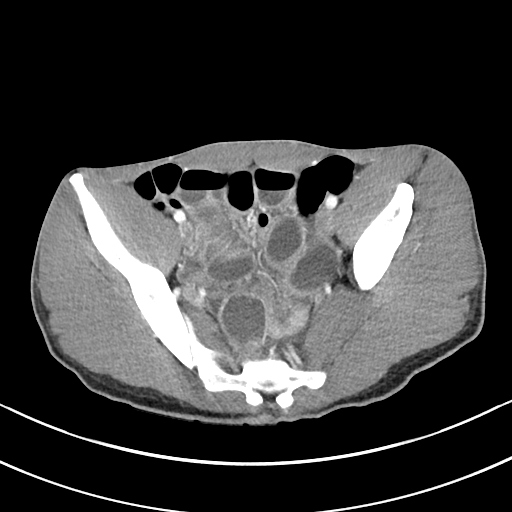
[im 31/92  soft-tissue]
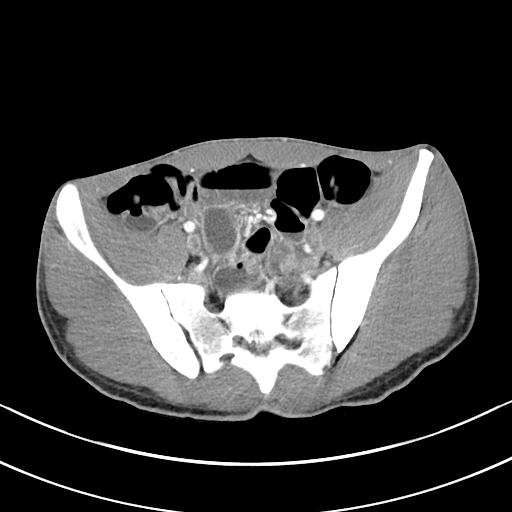
[im 41/92  soft-tissue]
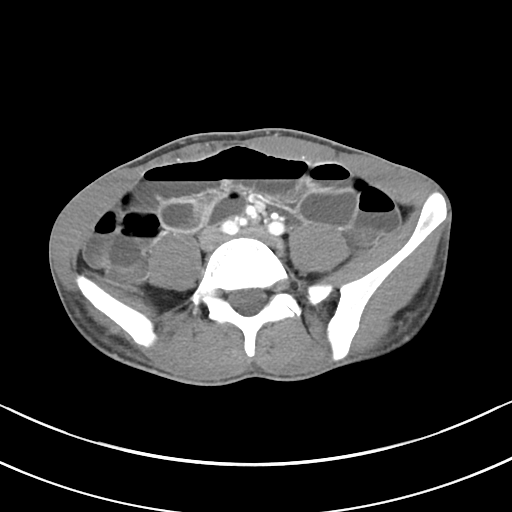
[im 46/92  soft-tissue]
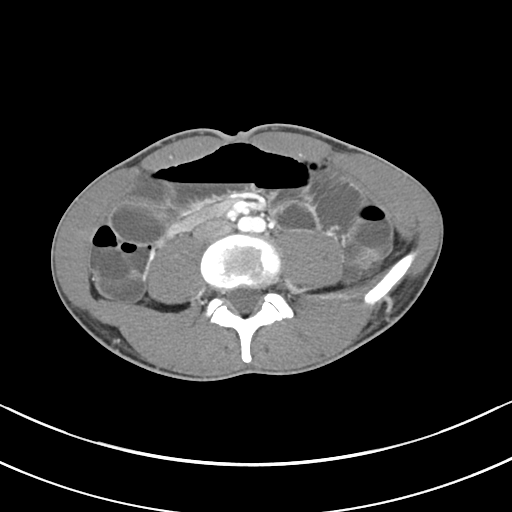
[im 51/92  soft-tissue]
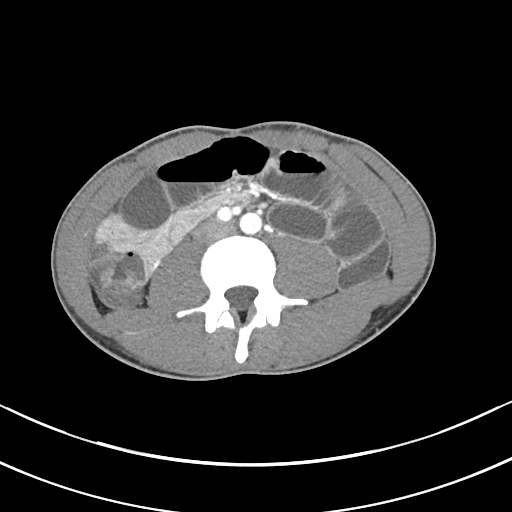
[im 61/92  soft-tissue]
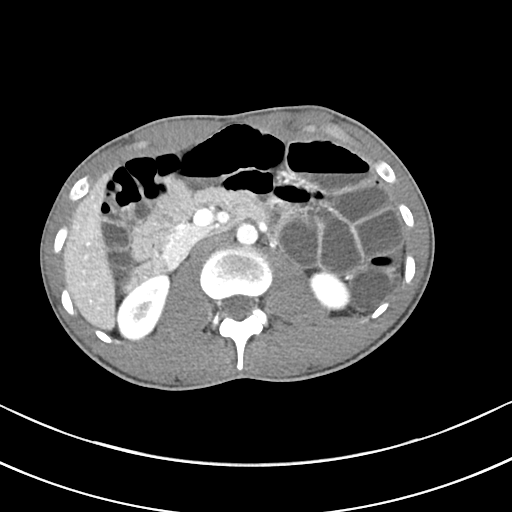
[im 61/92  bone]
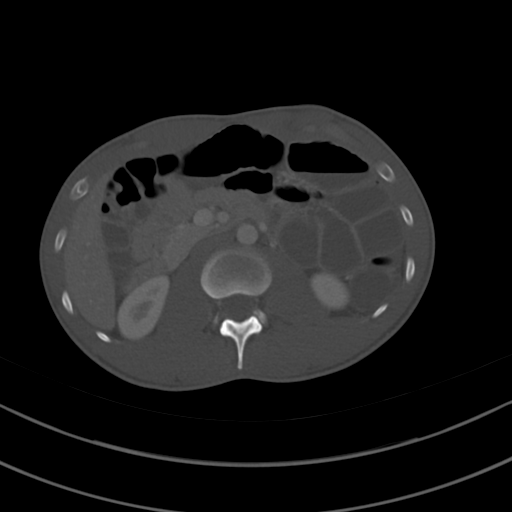
[im 66/92  soft-tissue]
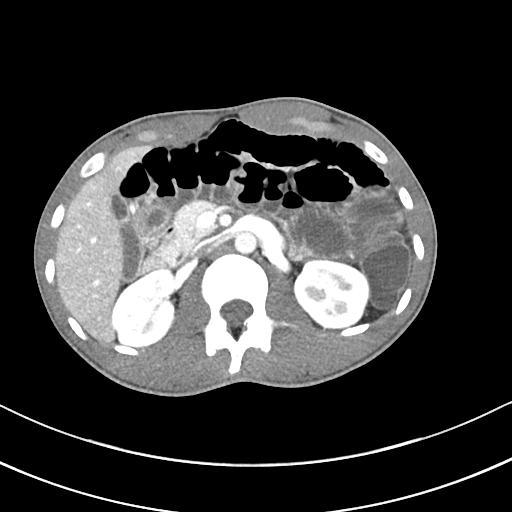
[im 71/92  soft-tissue]
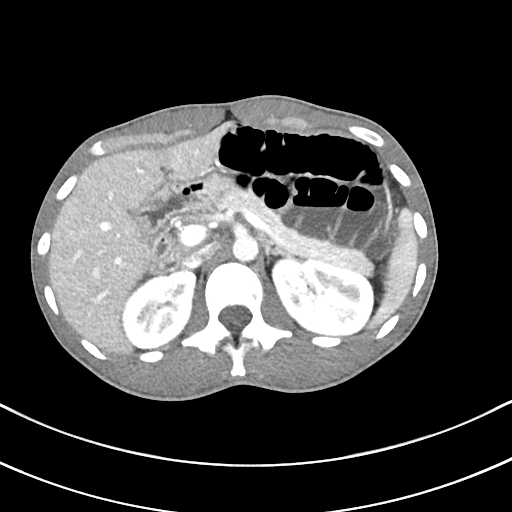
[im 81/92  soft-tissue]
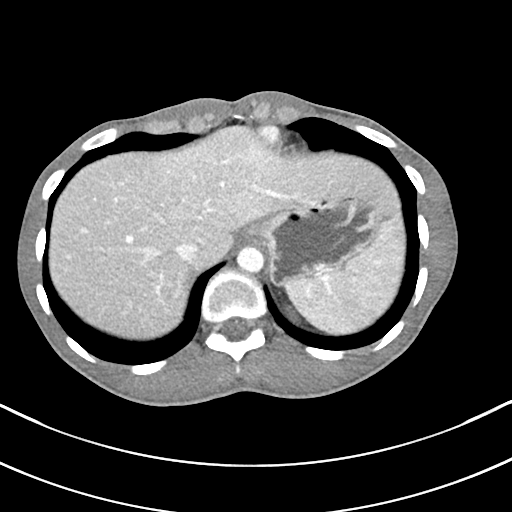
[im 86/92  soft-tissue]
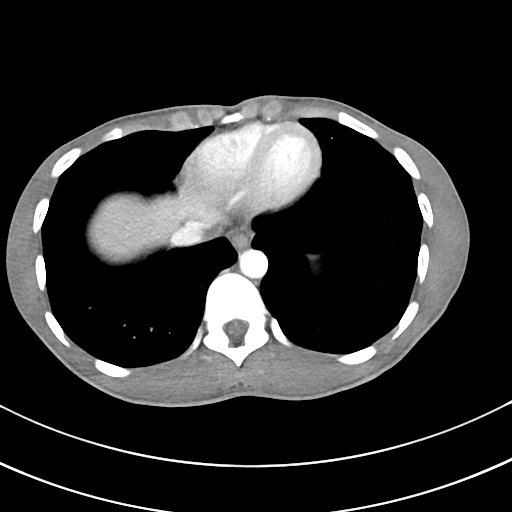

[Series 6: abdomen 3.0 mpr cor · coronal · 0.67mm/px · 3 of 68 slices shown]
[im 23/68  soft-tissue]
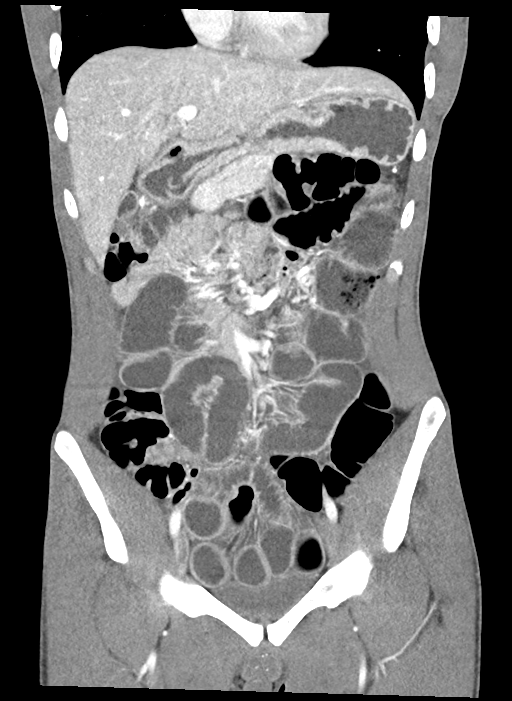
[im 30/68  soft-tissue]
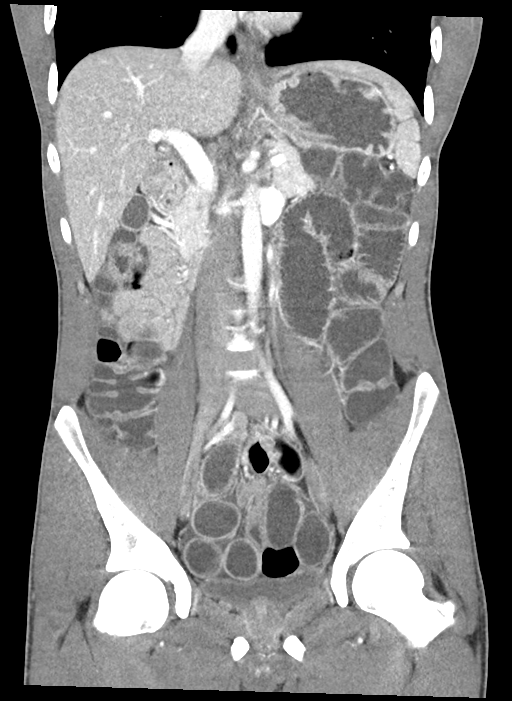
[im 38/68  soft-tissue]
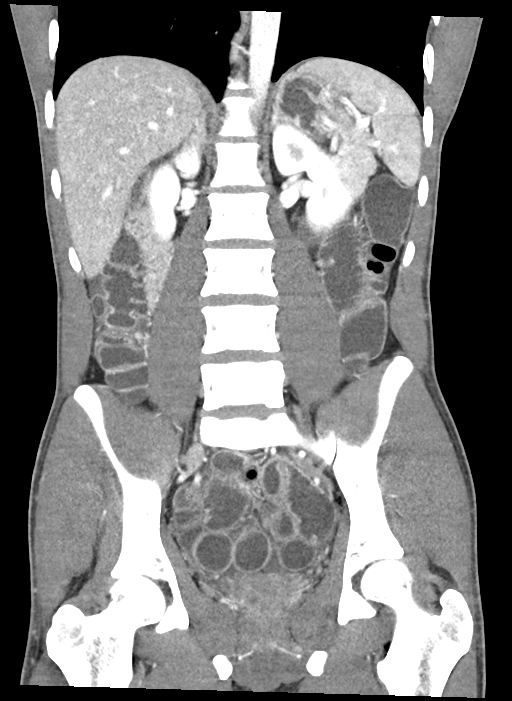

[16 of 46 positions shown; findings below may reference images not displayed]

FINDINGS: Lower chest: Clear lung bases.  The heart is normal in size.

Hepatobiliary: No focal liver abnormality is seen. No gallstones,
gallbladder wall thickening, or biliary dilatation.

Pancreas: Unremarkable. No pancreatic ductal dilatation or
surrounding inflammatory changes.

Spleen: Normal in size without focal abnormality.

Adrenals/Urinary Tract: Adrenal glands are unremarkable. Kidneys are
normal, without renal calculi, focal lesion, or hydronephrosis.
Bladder is unremarkable.

Stomach/Bowel: Stomach is unremarkable.

There are prominent loops of small bowel with air-fluid levels.
There is no wall thickening or adjacent inflammation.

Fluid is noted in the rectum and colon. No colonic dilation, wall
thickening or inflammation.

Normal appendix visualized.

Vascular/Lymphatic: No significant vascular findings are present. No
enlarged abdominal or pelvic lymph nodes.

Reproductive: Unremarkable.

Other: No abdominal wall hernia or abnormality. No abdominopelvic
ascites.

Musculoskeletal: No acute or significant osseous findings.
IMPRESSION: 1. Prominent small bowel loops. Small bowel air-fluid levels and
fluid in the colon and rectum. No bowel wall thickening or adjacent
inflammation. Findings are consistent with gastroenteritis. No
evidence of bowel obstruction.
2. No other abnormalities.  Normal appendix visualized.

## 2019-12-22 ENCOUNTER — Encounter (HOSPITAL_COMMUNITY): Payer: Self-pay

## 2019-12-22 ENCOUNTER — Other Ambulatory Visit: Payer: Self-pay

## 2019-12-22 ENCOUNTER — Emergency Department (HOSPITAL_COMMUNITY)
Admission: EM | Admit: 2019-12-22 | Discharge: 2019-12-24 | Disposition: A | Discharge status: Home / Self Care | Payer: Medicaid Other | Attending: Emergency Medicine | Admitting: Emergency Medicine

## 2019-12-22 DIAGNOSIS — F17200 Nicotine dependence, unspecified, uncomplicated: Secondary | ICD-10-CM | POA: Insufficient documentation

## 2019-12-22 DIAGNOSIS — F29 Unspecified psychosis not due to a substance or known physiological condition: Secondary | ICD-10-CM | POA: Insufficient documentation

## 2019-12-22 DIAGNOSIS — F1994 Other psychoactive substance use, unspecified with psychoactive substance-induced mood disorder: Secondary | ICD-10-CM

## 2019-12-22 DIAGNOSIS — F1924 Other psychoactive substance dependence with psychoactive substance-induced mood disorder: Secondary | ICD-10-CM | POA: Insufficient documentation

## 2019-12-22 DIAGNOSIS — R443 Hallucinations, unspecified: Secondary | ICD-10-CM

## 2019-12-22 DIAGNOSIS — Z20822 Contact with and (suspected) exposure to covid-19: Secondary | ICD-10-CM | POA: Insufficient documentation

## 2019-12-22 LAB — COMPREHENSIVE METABOLIC PANEL
ALT: 15 U/L (ref 0–44)
AST: 15 U/L (ref 15–41)
Albumin: 4 g/dL (ref 3.5–5.0)
Alkaline Phosphatase: 47 U/L (ref 38–126)
Anion gap: 14 (ref 5–15)
BUN: 10 mg/dL (ref 6–20)
CO2: 24 mmol/L (ref 22–32)
Calcium: 9.5 mg/dL (ref 8.9–10.3)
Chloride: 103 mmol/L (ref 98–111)
Creatinine, Ser: 0.9 mg/dL (ref 0.61–1.24)
GFR calc Af Amer: >60 mL/min (ref >60)
GFR calc non Af Amer: >60 mL/min (ref >60)
Glucose, Bld: 96 mg/dL (ref 70–99)
Potassium: 3.2 mmol/L — ABNORMAL LOW (ref 3.5–5.1)
Sodium: 141 mmol/L (ref 135–145)
Total Bilirubin: 1 mg/dL (ref 0.3–1.2)
Total Protein: 6.9 g/dL (ref 6.5–8.1)

## 2019-12-22 LAB — SALICYLATE LEVEL: Salicylate Lvl: <7 mg/dL — ABNORMAL LOW (ref 7.0–30.0)

## 2019-12-22 LAB — CBC WITH DIFFERENTIAL/PLATELET
Abs Immature Granulocytes: 0.02 10*3/uL (ref 0.00–0.07)
Basophils Absolute: 0.1 10*3/uL (ref 0.0–0.1)
Basophils Relative: 0 %
Eosinophils Absolute: 0.2 10*3/uL (ref 0.0–0.5)
Eosinophils Relative: 1 %
HCT: 36.8 % — ABNORMAL LOW (ref 39.0–52.0)
Hemoglobin: 12.7 g/dL — ABNORMAL LOW (ref 13.0–17.0)
Immature Granulocytes: 0 %
Lymphocytes Relative: 20 %
Lymphs Abs: 2.2 10*3/uL (ref 0.7–4.0)
MCH: 27.5 pg (ref 26.0–34.0)
MCHC: 34.5 g/dL (ref 30.0–36.0)
MCV: 79.7 fL — ABNORMAL LOW (ref 80.0–100.0)
Monocytes Absolute: 0.9 10*3/uL (ref 0.1–1.0)
Monocytes Relative: 8 %
Neutro Abs: 8 10*3/uL — ABNORMAL HIGH (ref 1.7–7.7)
Neutrophils Relative %: 71 %
Platelets: 260 10*3/uL (ref 150–400)
RBC: 4.62 MIL/uL (ref 4.22–5.81)
RDW: 14.1 % (ref 11.5–15.5)
WBC: 11.4 10*3/uL — ABNORMAL HIGH (ref 4.0–10.5)
nRBC: 0 % (ref 0.0–0.2)

## 2019-12-22 LAB — ACETAMINOPHEN LEVEL: Acetaminophen (Tylenol), Serum: <10 ug/mL — ABNORMAL LOW (ref 10–30)

## 2019-12-22 LAB — RESPIRATORY PANEL BY RT PCR (FLU A&B, COVID)
Influenza A by PCR: NEGATIVE
Influenza B by PCR: NEGATIVE
SARS Coronavirus 2 by RT PCR: NEGATIVE

## 2019-12-22 LAB — RAPID URINE DRUG SCREEN, HOSP PERFORMED
Amphetamines: NOT DETECTED
Barbiturates: NOT DETECTED
Benzodiazepines: POSITIVE — AB
Cocaine: NOT DETECTED
Opiates: NOT DETECTED
Tetrahydrocannabinol: POSITIVE — AB

## 2019-12-22 LAB — ETHANOL: Alcohol, Ethyl (B): <10 mg/dL (ref <10)

## 2019-12-22 MED ORDER — STERILE WATER FOR INJECTION IJ SOLN
INTRAMUSCULAR | Status: AC
  Administered 2019-12-22: 2.1 mL
  Filled 2019-12-22: qty 10

## 2019-12-22 MED ORDER — ZIPRASIDONE MESYLATE 20 MG IM SOLR
20.0000 mg | Freq: Once | INTRAMUSCULAR | Status: AC
  Administered 2019-12-22: 20 mg via INTRAMUSCULAR
  Filled 2019-12-22: qty 20

## 2019-12-22 MED ORDER — DIPHENHYDRAMINE HCL 50 MG/ML IJ SOLN
25.0000 mg | Freq: Once | INTRAMUSCULAR | Status: AC
  Administered 2019-12-22: 25 mg via INTRAMUSCULAR
  Filled 2019-12-22: qty 1

## 2019-12-22 MED ORDER — STERILE WATER FOR INJECTION IJ SOLN
INTRAMUSCULAR | Status: AC
  Filled 2019-12-22: qty 10

## 2019-12-22 MED ORDER — OLANZAPINE 10 MG IM SOLR
5.0000 mg | Freq: Once | INTRAMUSCULAR | Status: AC | PRN
  Administered 2019-12-22: 5 mg via INTRAMUSCULAR
  Filled 2019-12-22 (×2): qty 10

## 2019-12-22 MED ORDER — LORAZEPAM 2 MG/ML IJ SOLN
2.0000 mg | Freq: Once | INTRAMUSCULAR | Status: AC
  Administered 2019-12-22: 2 mg via INTRAMUSCULAR
  Filled 2019-12-22: qty 1

## 2019-12-22 NOTE — ED Triage Notes (Addendum)
PT is coming in in police custody, pt has been IVC'ed. Paper work states that he has been approaching people aggressively, and talking manically. Pt denies HI/SI when asked.

## 2019-12-22 NOTE — ED Notes (Signed)
TTS is currently talking to patient

## 2019-12-22 NOTE — ED Notes (Signed)
Pt kicking wall and threatening to take the police officer's guns and harm himself or other staff members. PA made aware.

## 2019-12-22 NOTE — BH Assessment (Addendum)
Assessment Note  Seth Fuentes is an 29 y.o. male. Patient's mother taken out papers, states he has been acting erratically, talking to himself, and has displayed aggressive behavior towards others.  He has been found wandering in the street and has not been making sense.  Per IVC papers, he is supposed to be taking prozac but unclear if he has been compliant.  Upon assessing patient today he indicates that he doesn't know why he was brought to [facility]. States that he was at his hotel room when #2 officers arrive, put him in handcuffs and transported him to [facility]. Patient living at "In [address]" x6 months. Patient with tangential thought processing throughout today's assessment. 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 He states, "I hears voices that people can change the world". States that sometimes he is unable to make out the voices. When asked about the onset of the auditory hallucinations he was unable to provide any information. Patient then became hyper-religous with irritable. He was observed looking around the room as if responding to internal stimuli. Patient admits that he feels paranoid at times but denies that he feels that way today.   Patient denies SI. Denies history of suicidal gestures/intent. No self mutilating behaviors. Denies depressive symptoms. Reports mild symptoms of anxiety. He indicates that family members have mental health diagnoses but unable to elaborate. He denies that he has an outpatient therapist and/or psychiatrist. States, "I don't need to see anyone that does that because I'm not crazy".  Says that he was prescribed Latuda in the past but doesn't recall when and who prescribed this medication. States that he was hospitalized at [facility] in the past because his mother IVC'd him. He does not recall the reason for the IVC. Denies substance use. However, UDS is positive for THC and Benzo's.   During  today's assessment patient became tearful and attempted to leave the room. Clinician asked patient to return and he cooperated. Patient is alert but not oriented to time/situation. Speech is pressured. Mood is anxious and irritable. His insight and judgement are poor. Memory is recent intact. Remote impaired.    Diagnosis: Acute Psychosis   Past Medical History: History reviewed. No pertinent past medical history.  History reviewed. No pertinent surgical history.  Family History: No family history on file.  Social History:  reports that he has been smoking. He has never used smokeless tobacco. He reports previous alcohol use. He reports previous drug use.  Additional Social History:  Alcohol / Drug Use Pain Medications: SEE MAR Prescriptions: SEE MAR Over the Counter: SEE MAR History of alcohol / drug use?: Yes Substance #1 Name of Substance 1: Patient denied substance use; UDS positive for Benzo's. Substance #2 Name of Substance 2: Patient denied substance use; UDS positive for THC.Marland Kitchen  CIWA: CIWA-Ar BP: (!) 135/103 Pulse Rate: (!) 106 COWS:    Allergies: No Known Allergies  Home Medications: (Not in a hospital admission)   OB/GYN Status:  No LMP for male patient.  General Assessment Data [facility] of Assessment: [facility] TTS Assessment: In system Is this a Tele or Face-to-Face Assessment?: Tele Assessment Is this an Initial Assessment or a Re-assessment for this encounter?: Initial Assessment Patient Accompanied by::  ([employer]) Language Other than English: No Living Arrangements: Other (Comment) (living in hotel-In [employer] x6 months ) What gender do you identify as?: Male Date Telepsych consult ordered in [facility]:  (12/21/2019) Marital  status: Single Maiden name:  (n/a) Pregnancy Status: No Living Arrangements:  (patient lives in a hotel) Can pt return to current living arrangement?: Yes Admission Status: Voluntary Is patient capable of signing voluntary admission?:  Yes Referral Source: Self/Family/Friend Insurance type:  (none listed)  Medical Screening Exam [facility] Walk-in ONLY) Medical Exam completed: No  Crisis Care Plan Living Arrangements:  (patient lives in a hotel) Legal Guardian:  (no legal guardian ) Name of Psychiatrist:  (no psychiatrist ) Name of Therapist:  (no therapist )  Education Status Is patient currently in school?: No Is the patient employed, unemployed or receiving disability?: Unemployed  Risk to self with the past 6 months Suicidal Ideation: No Has patient been a risk to self within the past 6 months prior to admission? : No Suicidal Intent: No Has patient had any suicidal intent within the past 6 months prior to admission? : No Is patient at risk for suicide?: No Suicidal Plan?: No Has patient had any suicidal plan within the past 6 months prior to admission? : No Access to Means: No What has been your use of drugs/alcohol within the last 12 months?:  (Pt denies; UDS + for Benzo's and THC) Previous Attempts/Gestures: No How many times?:  (0) Other Self Harm Risks:  (denies ) Triggers for Past Attempts:  (denies prior attempts ) Intentional Self Injurious Behavior: None Family Suicide History:  ("I have family with mental health illnesses") Recent stressful life event(s): Other (Comment) ("I want to work") Persecutory voices/beliefs?: No Depression: No Depression Symptoms:  (denies depressive symptoms ) Substance abuse history and/or treatment for substance abuse?: No Suicide prevention information given to non-admitted patients: Not applicable  Risk to Others within the past 6 months Homicidal Ideation: No Does patient have any lifetime risk of violence toward others beyond the six months prior to admission? : No Thoughts of Harm to Others: No Current Homicidal Intent: No Current Homicidal Plan: No Access to Homicidal Means: No Identified Victim:  (n/a) History of harm to others?: No Assessment of Violence:  None Noted Violent Behavior Description:  (patient  ) Does patient have access to weapons?:  (denies ) Criminal Charges Pending?: Yes ("for a rental tv" ; spent 5 yrs in prison) Describe Pending Criminal Charges:  (sts he didn't return a Automotive engineer and was charged) Does patient have a court date: Yes ("I don't know exactly but it's going to be next month") Court Date:  ("I dont' know, sometime next month") Is patient on probation?: Unknown ("I don't think so")  Psychosis Hallucinations: Auditory ("Voices say people will change the world") Delusions:  (hyper-religous and tangential thinking )  Mental Status Report Appearance/Hygiene: In scrubs Eye Contact: Fair Motor Activity: Freedom of movement Speech: Rapid Level of Consciousness: Alert, Irritable Mood: Apprehensive Affect: Preoccupied, Apprehensive Anxiety Level: Minimal Thought Processes: Circumstantial, Flight of Ideas, Tangential Judgement: Impaired Orientation: Person, Place, Time, Situation Obsessive Compulsive Thoughts/Behaviors: None  Cognitive Functioning Concentration: Decreased Memory: Recent Intact, Remote Intact Is patient IDD: No Insight: Poor Impulse Control: Poor Appetite: Fair Have you had any weight changes? : No Change Sleep: Decreased Total Hours of Sleep:  (varies ) Vegetative Symptoms: None  ADLScreening [facility] Assessment Services) Patient's cognitive ability adequate to safely complete daily activities?: Yes Patient able to express need for assistance with ADLs?: Yes Independently performs ADLs?: Yes (appropriate for developmental age)  Prior Inpatient Therapy Prior Inpatient Therapy: Yes [facility] Therapy Facilty[facility]  [facility] ) Reason for Treatment:  (IVC'd by mother (doesn't recall date))  Prior Outpatient Therapy Prior Outpatient Therapy: No Does patient have an ACCT team?: No Does patient have Intensive In-House Services?  : No Does patient have [facility] services? :  No Does patient have P4CC services?: No  ADL Screening (condition at time of admission) Patient's cognitive ability adequate to safely complete daily activities?: Yes Patient able to express need for assistance with ADLs?: Yes Independently performs ADLs?: Yes (appropriate for developmental age)             Merchant navy officer (For Healthcare) Does Patient Have a Medical Advance Directive?: No Would patient like information on creating a medical advance directive?: Yes (ED - Information included in AVS)          Disposition: Per Berneice Heinrich, NP, patient meets criteria for inpatient treatment. Disposition LCSW to seek appropriate placement. Ronnie. Ronnie, LCSW  indicated that it's no available beds at [facility].   Ronnie, LCSW has faxed patient to several facilities for consideration of bed placement.   Disposition Initial Assessment Completed for this Encounter: Yes Disposition of Patient: Admit (Per Berneice Heinrich, NP, patient meets criteria for INPT criteria ) Type of inpatient treatment program: Adult  On Site Evaluation by:   Reviewed with Physician:    Melynda Ripple 12/22/2019 7:41 PM
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
## 2019-12-22 NOTE — ED Notes (Signed)
Ronnie with social work called and IVC ppw to be faxed to [employer]

## 2019-12-22 NOTE — ED Notes (Signed)
Pt sleeping in stretcher, breathing even and unlabored. NAD. Offered breakfast, pt denied being hungry at this time.

## 2019-12-22 NOTE — ED Notes (Signed)
Spoke with TTS, verified pt on list to be seen.

## 2019-12-22 NOTE — BH Assessment (Addendum)
Clinician attempting to assess patient. Called TTS machine at [facility], no answer. Called again but machine was frozen. Called again and TTS machine is having a connectively issue, screen frozen. Notified nursing staff that TTS machine usually has a bad connection on the hall in which patient is located. Per nursing Carlena Sax, RN),  staff will be moved to TCU for a better connection. This clinician has asked nursing staff to reach out to TTS once patient is ready to be seen an in a room.

## 2019-12-22 NOTE — Consult Note (Signed)
  Patient chart reviewed and discussed with Dr. Nelly Rout.  Patient presents under involuntary commitment with aggressive behavior and symptoms of mania.  Patient continues to exhibit aggressive behaviors in emergency department.  Currently inpatient psychiatric treatment is recommended.  Recommend medication: -Zyprexa 5 mg twice daily scheduled

## 2019-12-22 NOTE — ED Notes (Signed)
Pt attempting to leave after TTS consult. Police and security at bedside. Explanation of IVC given. Zyprexa administered.

## 2019-12-22 NOTE — Progress Notes (Signed)
[facility] called requesting additional information on patient and IVC paperwork.  Information faxed to Selena Batten, fx# [phone].  Ladoris Gene MSW,LCSWA,LCASA Clinical Social Worker  [place] Disposition, CSW [phone] (cell)

## 2019-12-22 NOTE — ED Provider Notes (Signed)
Emergency Medicine Observation Re-evaluation Note  0700: Patient handed off to me by previous ED PA at shift change pending psychiatric evaluation.  Please see previous note for full details.  Seth Fuentes is a 29 y.o. male who presented to the ED under IVC filled out by patient's mother.  Patient was very agitated, aggressive and required chemical sedation including 20 mg of Geodon, 2 mg of Ativan, 25 mg of Benadryl.  He is now asleep.  High risk for agitation, aggression.     Physical Exam  BP (!) 188/72 (BP Location: Right Arm)   Pulse 68   Temp 98.2 F (36.8 C) (Oral)   Resp 16   SpO2 100%  Physical Exam General: asleep in hall bed Lungs: SpO2 96%   ED Course / MDM  EKG:    I have reviewed the labs performed to date as well as medications administered while in observation.   Plan   ER work up thus far reviewed. Minimal leukocytosis and anemia, not significant. Mild hypokalemia 3.2. undetectable ASA, APAP, ETOH.   0840: Current plan is for TTS evaluation, TTS order at 0416.  Patient is under full IVC at this time.   Liberty Handy, PA-C 12/22/19 2694    Tilden Fossa, MD 12/22/19 [phone]

## 2019-12-22 NOTE — ED Notes (Signed)
Patient is talking with TTS at this time.

## 2019-12-22 NOTE — ED Notes (Signed)
Pt sleeping in stretcher, breathing even and unlabored. Wakes easily to verbal stimuli.

## 2019-12-22 NOTE — ED Notes (Signed)
Pt was offered a meal tray, but he declined to eat.

## 2019-12-22 NOTE — BH Assessment (Signed)
Clinician has secure chatted nursing staff requesting TTS machine to be placed in patients room 1730. TTS will be ready to initiate/complete TTS assessment.

## 2019-12-22 NOTE — Progress Notes (Signed)
Per Berneice Heinrich, NP patient meets inpatient criteria.  Referrals faxed to the following facilities:    [facility]        [facility]        [facility]        [facility]        [facility]        [facility]        [facility]        [facility]        [facility]        [facility]        [facility]        [facility]        [facility]        [facility]       CSW to follow up.  Ladoris Gene MSW,LCSWA,LCASA Clinical Social Worker  [place] [facility] Disposition, CSW [phone] (cell)

## 2019-12-22 NOTE — ED Notes (Signed)
Assumed care of pt at this time. Pt resting in stretcher, no s/sx of acute distress at this time.  

## 2019-12-22 NOTE — BH Assessment (Addendum)
Carlena Sax, RN, states that their is another patient in room #16 that will be discharged shortly. Patient will then be placed in that room for his TTS assessment to be imitated and completed. Clinician has asked Carlena Sax, RN to contact TTS when paient is ready to be seen and a room.

## 2019-12-23 MED ORDER — OLANZAPINE 5 MG PO TABS
5.0000 mg | ORAL_TABLET | Freq: Every day | ORAL | Status: DC
  Administered 2019-12-23: 5 mg via ORAL
  Filled 2019-12-23: qty 1

## 2019-12-23 NOTE — ED Notes (Signed)
Pt states that he would like to go home.  He has been calm and cooperative this am.  Mostly resting and sitter at bedside. Pt is frustrated but continues to be polite and cooperative.  Advised him that we are waiting on placement while pt is IVC and pt declines phone privileges.

## 2019-12-23 NOTE — Progress Notes (Signed)
RN called and asked for clarification and CSW alerted R to CM's note located under chart review stating pt's referral still being reviewed by [place].  CSW will continue to follow for D/C needs.  Dorothe Pea. Avalynne Diver  MSW, LCSW, LCAS, CCS Transitions of Care Clinical Social Worker Care Coordination Department Ph: [phone]

## 2019-12-23 NOTE — ED Notes (Signed)
Call to Social Work to clarify regarding pt placement.  Pt is still waiting to hear back from [place]. Pt has not been accepted anywhere yet.

## 2019-12-23 NOTE — ED Provider Notes (Signed)
Emergency Medicine Observation Re-evaluation Note  Jakhai Fant is a 29 y.o. male, seen on rounds today.  Pt initially presented to the ED for complaints of Medical Clearance and IVC Currently, the patient is sleeping.  Physical Exam  BP 110/76 (BP Location: Right Arm)   Pulse 67   Temp 99.1 F (37.3 C) (Oral)   Resp 16   SpO2 99%  Physical Exam General: well appearing, sleeping Cardiac: normal rate Lungs: no increased wob, no abnormal lung sounds Psych: sleeping  ED Course / MDM  EKG:    I have reviewed the labs performed to date as well as medications administered while in observation.  Recent changes in the last 24 hours include none.  Plan  Current plan is for awaiting placement. Patient is under full IVC at this time.   Rolan Bucco, MD 12/23/19 [phone]

## 2019-12-23 NOTE — Consult Note (Signed)
Telepsych Consultation   Reason for Consult:  Psych Consult Referring Physician:  EDP Location of Patient: [facility]  Location of Provider: Other: [facility]  Patient Identification: Seth Fuentes MRN:  [id number] Principal Diagnosis: <principal problem not specified> Diagnosis:  Active Problems:   * No active hospital problems. *   Total Time spent with patient: 45 minutes  Subjective:   Today he is alert and oriented, calm and cooperative, lucid and normal thought processes. He states he has been smoking THC and he doesn't like the way he acts when he came into the hospital. Patient originally had limited insight into his substance use, however he finally verbalize and admitted to use of THC. He does seem to be motivated to engage in any relapse prevenative measures at this time. Today he is unable to recall the incidents leading to his admission. He continues to perseverate about his mother and her behaviors, and how she treats him "she labels me the crazy one and puts me off as crazy. I cant even talk to her. "    HPI: Seth Fuentes is an 29 y.o. male. Patient's mother taken out papers, states he has been acting erratically, talking to himself, and has displayed aggressive behavior towards others. He has been found wandering in the street and has not been making sense. Per IVC papers, he is supposed to be taking prozac but unclear if he has been compliant.  Upon assessing patient today he indicates that he doesn't know why he was brought to [facility]. States that he was at his hotel room when #2 officers arrive, put him in handcuffs and transported him to [facility]. Patient living at "In [address] x6 months. Patient with tangential thought processing throughout today's assessment. 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 He states, "I hears voices that people can change the world". States that sometimes he is unable to make out the voices. When asked  about the onset of the auditory hallucinations he was unable to provide any information. Patient then became hyper-religous with irritable. He was observed looking around the room as if responding to internal stimuli. Patient admits that he feels paranoid at times but denies that he feels that way today.   Patient denies SI. Denies history of suicidal gestures/intent. No self mutilating behaviors. Denies depressive symptoms. Reports mild symptoms of anxiety. He indicates that family members have mental health diagnoses but unable to elaborate. He denies that he has an outpatient therapist and/or psychiatrist. States, "I don't need to see anyone that does that because I'm not crazy".  Says that he was prescribed Latuda in the past but doesn't recall when and who prescribed this medication. States that he was hospitalized at [facility] in the past because his mother IVC'd him. He does not recall the reason for the IVC. Denies substance use. However, UDS is positive for THC and Benzo's.   During today's assessment patient became tearful and attempted to leave the room. Clinician asked patient to return and he cooperated. Patient is alert but not oriented to time/situation. Speech is pressured. Mood is anxious and irritable. His insight and judgement are poor. Memory is recent intact. Remote impaired.    Past Psychiatric History: Substance induce psychosis  Risk to Self: Suicidal Ideation: No Suicidal Intent: No Is patient at risk for suicide?: No Suicidal Plan?: No Access to Means: No What has been your use of drugs/alcohol within the last 12 months?:  (  Pt denies; UDS + for Benzo's and THC) How many times?:  (0) Other Self Harm Risks:  (denies ) Triggers for Past Attempts:  (denies prior attempts ) Intentional Self Injurious Behavior: None Risk to Others: Homicidal Ideation: No Thoughts of Harm to Others: No Current Homicidal Intent: No Current Homicidal Plan: No Access to  Homicidal Means: No Identified Victim:  (n/a) History of harm to others?: No Assessment of Violence: None Noted Violent Behavior Description:  (patient  ) Does patient have access to weapons?:  (denies ) Criminal Charges Pending?: Yes ("for a rental tv" ; spent 5 yrs in prison) Describe Pending Criminal Charges:  (sts he didn't return a Automotive engineer and was charged) Does patient have a court date: Yes ("I don't know exactly but it's going to be next month") Court Date:  ("I dont' know, sometime next month") Prior Inpatient Therapy: Prior Inpatient Therapy: Yes Prior Therapy Facilty/Provider(s):  ([facility] ) Reason for Treatment:  (IVC'd by mother (doesn't recall date)) Prior Outpatient Therapy: Prior Outpatient Therapy: No Does patient have an ACCT team?: No Does patient have Intensive In-House Services?  : No Does patient have [facility] services? : No Does patient have P4CC services?: No  Past Medical History: History reviewed. No pertinent past medical history. History reviewed. No pertinent surgical history. Family History: No family history on file. Family Psychiatric  History: Denies Social History:  Social History   Substance and Sexual Activity  Alcohol Use Not Currently     Social History   Substance and Sexual Activity  Drug Use Not Currently    Social History   Socioeconomic History  . Marital status: Single    Spouse name: Not on file  . Number of children: Not on file  . Years of education: Not on file  . Highest education level: Not on file  Occupational History  . Not on file  Tobacco Use  . Smoking status: Current Every Day Smoker  . Smokeless tobacco: Never Used  Substance and Sexual Activity  . Alcohol use: Not Currently  . Drug use: Not Currently  . Sexual activity: Not on file  Other Topics Concern  . Not on file  Social History Narrative  . Not on file   Social Determinants of Health   Financial Resource Strain:   . Difficulty of  Paying Living Expenses: Not on file  Food Insecurity:   . Worried About Programme researcher, broadcasting/film/video in the Last Year: Not on file  . Ran Out of Food in the Last Year: Not on file  Transportation Needs:   . Lack of Transportation (Medical): Not on file  . Lack of Transportation (Non-Medical): Not on file  Physical Activity:   . Days of Exercise per Week: Not on file  . Minutes of Exercise per Session: Not on file  Stress:   . Feeling of Stress : Not on file  Social Connections:   . Frequency of Communication with Friends and Family: Not on file  . Frequency of Social Gatherings with Friends and Family: Not on file  . Attends Religious Services: Not on file  . Active Member of Clubs or Organizations: Not on file  . Attends Banker Meetings: Not on file  . Marital Status: Not on file   Additional Social History:    Allergies:  No Known Allergies  Labs:  Results for orders placed or performed during the hospital encounter of 12/22/19 (from the past 48 hour(s))  Respiratory Panel by RT PCR (  Flu A&B, Covid) - Nasopharyngeal Swab     Status: None   Collection Time: 12/22/19  2:47 AM   Specimen: Nasopharyngeal Swab  Result Value Ref Range   SARS Coronavirus 2 by RT PCR NEGATIVE NEGATIVE    Comment: (NOTE) SARS-CoV-2 target nucleic acids are NOT DETECTED.  The SARS-CoV-2 RNA is generally detectable in upper respiratoy specimens during the acute phase of infection. The lowest concentration of SARS-CoV-2 viral copies this assay can detect is 131 copies/mL. A negative result does not preclude SARS-Cov-2 infection and should not be used as the sole basis for treatment or other patient management decisions. A negative result may occur with  improper specimen collection/handling, submission of specimen other than nasopharyngeal swab, presence of viral mutation(s) within the areas targeted by this assay, and inadequate number of viral copies (<131 copies/mL). A negative result must  be combined with clinical observations, patient history, and epidemiological information. The expected result is Negative.  Fact Sheet for Patients:  https://www.moore.com/https://www.fda.gov/media/142436/download  Fact Sheet for Healthcare Providers:  https://www.young.biz/https://www.fda.gov/media/142435/download  This test is no t yet approved or cleared by the [place] FDA and  has been authorized for detection and/or diagnosis of SARS-CoV-2 by FDA under an Emergency Use Authorization (EUA). This EUA will remain  in effect (meaning this test can be used) for the duration of the COVID-19 declaration under Section 564(b)(1) of the Act, 21 U.S.C. section 360bbb-3(b)(1), unless the authorization is terminated or revoked sooner.     Influenza A by PCR NEGATIVE NEGATIVE   Influenza B by PCR NEGATIVE NEGATIVE    Comment: (NOTE) The Xpert Xpress SARS-CoV-2/FLU/RSV assay is intended as an aid in  the diagnosis of influenza from Nasopharyngeal swab specimens and  should not be used as a sole basis for treatment. Nasal washings and  aspirates are unacceptable for Xpert Xpress SARS-CoV-2/FLU/RSV  testing.  Fact Sheet for Patients: https://www.moore.com/https://www.fda.gov/media/142436/download  Fact Sheet for Healthcare Providers: https://www.young.biz/https://www.fda.gov/media/142435/download  This test is not yet approved or cleared by the [place] FDA and  has been authorized for detection and/or diagnosis of SARS-CoV-2 by  FDA under an Emergency Use Authorization (EUA). This EUA will remain  in effect (meaning this test can be used) for the duration of the  Covid-19 declaration under Section 564(b)(1) of the Act, 21  U.S.C. section 360bbb-3(b)(1), unless the authorization is  terminated or revoked. Performed at [facility], [address] [place], [place] [id number]   CBC with Differential     Status: Abnormal   Collection Time: 12/22/19  3:00 AM  Result Value Ref Range   WBC 11.4 (H) 4.0 - 10.5 K/uL   RBC 4.62 4.22 - 5.81 MIL/uL    Hemoglobin 12.7 (L) 13.0 - 17.0 g/dL   HCT 52.836.8 (L) 39 - 52 %   MCV 79.7 (L) 80.0 - 100.0 fL   MCH 27.5 26.0 - 34.0 pg   MCHC 34.5 30.0 - 36.0 g/dL   RDW 41.314.1 24.411.5 - 01.015.5 %   Platelets 260 150 - 400 K/uL   nRBC 0.0 0.0 - 0.2 %   Neutrophils Relative % 71 %   Neutro Abs 8.0 (H) 1.7 - 7.7 K/uL   Lymphocytes Relative 20 %   Lymphs Abs 2.2 0.7 - 4.0 K/uL   Monocytes Relative 8 %   Monocytes Absolute 0.9 0 - 1 K/uL   Eosinophils Relative 1 %   Eosinophils Absolute 0.2 0 - 0 K/uL   Basophils Relative 0 %   Basophils Absolute 0.1  0 - 0 K/uL   Immature Granulocytes 0 %   Abs Immature Granulocytes 0.02 0.00 - 0.07 K/uL    Comment: Performed at [facility], [address] [place], [place] [address]  Comprehensive metabolic panel     Status: Abnormal   Collection Time: 12/22/19  3:00 AM  Result Value Ref Range   Sodium 141 135 - 145 mmol/L   Potassium 3.2 (L) 3.5 - 5.1 mmol/L   Chloride 103 98 - 111 mmol/L   CO2 24 22 - 32 mmol/L   Glucose, Bld 96 70 - 99 mg/dL    Comment: Glucose reference range applies only to samples taken after fasting for at least 8 hours.   BUN 10 6 - 20 mg/dL   Creatinine, Ser 6.07 0.61 - 1.24 mg/dL   Calcium 9.5 8.9 - 37.1 mg/dL   Total Protein 6.9 6.5 - 8.1 g/dL   Albumin 4.0 3.5 - 5.0 g/dL   AST 15 15 - 41 U/L   ALT 15 0 - 44 U/L   Alkaline Phosphatase 47 38 - 126 U/L   Total Bilirubin 1.0 0.3 - 1.2 mg/dL   GFR calc non Af Amer >60 >60 mL/min   GFR calc Af Amer >60 >60 mL/min   Anion gap 14 5 - 15    Comment: Performed at [facility], [address] [place], [place] [address]  Ethanol     Status: None   Collection Time: 12/22/19  3:00 AM  Result Value Ref Range   Alcohol, Ethyl (B) <10 <10 mg/dL    Comment: (NOTE) Lowest detectable limit for serum alcohol is 10 mg/dL.  For medical purposes only. Performed at [facility], [address] [place], [place] [address]   Salicylate level      Status: Abnormal   Collection Time: 12/22/19  3:00 AM  Result Value Ref Range   Salicylate Lvl <7.0 (L) 7.0 - 30.0 mg/dL    Comment: Performed at [facility], [address] [place], [place] [address]  Acetaminophen level     Status: Abnormal   Collection Time: 12/22/19  3:00 AM  Result Value Ref Range   Acetaminophen (Tylenol), Serum <10 (L) 10 - 30 ug/mL    Comment: (NOTE) Therapeutic concentrations vary significantly. A range of 10-30 ug/mL  may be an effective concentration for many patients. However, some  are best treated at concentrations outside of this range. Acetaminophen concentrations >150 ug/mL at 4 hours after ingestion  and >50 ug/mL at 12 hours after ingestion are often associated with  toxic reactions.  Performed at [facility], [address], [place], [place] [address]   Rapid urine drug screen (hospital performed)     Status: Abnormal   Collection Time: 12/22/19  5:50 PM  Result Value Ref Range   Opiates NONE DETECTED NONE DETECTED   Cocaine NONE DETECTED NONE DETECTED   Benzodiazepines POSITIVE (A) NONE DETECTED   Amphetamines NONE DETECTED NONE DETECTED   Tetrahydrocannabinol POSITIVE (A) NONE DETECTED   Barbiturates NONE DETECTED NONE DETECTED    Comment: (NOTE) DRUG SCREEN FOR MEDICAL PURPOSES ONLY.  IF CONFIRMATION IS NEEDED FOR ANY PURPOSE, NOTIFY LAB WITHIN 5 DAYS.  LOWEST DETECTABLE LIMITS FOR URINE DRUG SCREEN Drug Class                     Cutoff (ng/mL) Amphetamine and metabolites    1000 Barbiturate and metabolites    200 Benzodiazepine  200 Tricyclics and metabolites     300 Opiates and metabolites        300 Cocaine and metabolites        300 THC                            50 Performed at [facility], [address], [place], [place] [address]     Medications:  No current facility-administered medications for this encounter.   Current Outpatient  Medications  Medication Sig Dispense Refill  . dicyclomine (BENTYL) 20 MG tablet Take 1 tablet (20 mg total) by mouth 2 (two) times daily. (Patient not taking: Reported on 12/23/2019) 10 tablet 0  . diphenoxylate-atropine (LOMOTIL) 2.5-0.025 MG tablet Take 2 tablets by mouth 4 (four) times daily as needed for diarrhea or loose stools. (Patient not taking: Reported on 12/23/2019) 30 tablet 0  . ondansetron (ZOFRAN) 4 MG tablet Take 1 tablet (4 mg total) by mouth every 8 (eight) hours as needed for nausea or vomiting. (Patient not taking: Reported on 11/20/2017) 8 tablet 0  . promethazine (PHENERGAN) 25 MG tablet Take 1 tablet (25 mg total) by mouth every 6 (six) hours as needed for nausea or vomiting. (Patient not taking: Reported on 12/23/2019) 15 tablet 0    Musculoskeletal: Strength & Muscle Tone: within normal limits Gait & Station: normal Patient leans: N/A  Psychiatric Specialty Exam: Physical Exam  Review of Systems  Blood pressure 123/78, pulse 82, temperature 98.8 F (37.1 C), temperature source Oral, resp. rate 16, SpO2 100 %.There is no height or weight on file to calculate BMI.  General Appearance: Fairly Groomed  Eye Contact:  Fair  Speech:  Clear and Coherent and Normal Rate  Volume:  Normal  Mood:  Anxious and Cooperative  Affect:  Appropriate and Congruent  Thought Process:  Coherent, Linear and Descriptions of Associations: Intact  Orientation:  Full (Time, Place, and Person)  Thought Content:  Logical  Suicidal Thoughts:  No  Homicidal Thoughts:  No  Memory:  Immediate;   Fair Recent;   Fair  Judgement:  Fair  Insight:  Fair  Psychomotor Activity:  Normal  Concentration:  Concentration: Fair and Attention Span: Fair  Recall:  [employer] of Knowledge:  Fair  Language:  Fair  Akathisia:  No  Handed:  Right  AIMS (if indicated):     Assets:  Communication Skills Desire for Improvement Financial Resources/Insurance Physical Health Resilience Social  Support Transportation Vocational/Educational  ADL's:  Intact  Cognition:  WNL  Sleep:        Treatment Plan Summary: Daily contact with patient to assess and evaluate symptoms and progress in treatment, Medication management and Plan Patient has greatly improved since his admission almost 2 days ago. Discussed with patient about his presentation while under the influence of THC. He appears to be open to outpatient psych services to include a therapist. He reports not taking any medications,to include prozac that was previously listed. Patient remains under IVC, he is advised if he continues to improve in his mood and mentation , will likely rescind IVC and discharge home tomorrow.  Will schedule oral zyprexa tonight to help with mood agitaiton and psychosis.  Disposition: As he is today he does not meet criteria for inpatient, however due to his previous presentation and level of aggression will reobserve and reassess tomorrow with likely discharge home. He remains under full IVC at this time.   This service  was provided via telemedicine using a 2-way, interactive audio and video technology.  Names of all persons participating in this telemedicine service and their role in this encounter. Name: Caryn Bee Role: PMHNP-BC  Name: [name] Role: Patient    Maryagnes Amos, FNP 12/23/2019 5:45 PM

## 2019-12-23 NOTE — Care Management (Signed)
[facility] reports that they have not reviewed paperwork but would call once the referral was reviewed.   [facility] reports that they have received a lot of referrals and have not reviewed any of the referrals as of yet.

## 2019-12-23 NOTE — ED Notes (Signed)
Pt is doing tele psych at this time.  Pt has had dinner and has remained calm and cooperative.

## 2019-12-24 DIAGNOSIS — F1994 Other psychoactive substance use, unspecified with psychoactive substance-induced mood disorder: Secondary | ICD-10-CM

## 2019-12-24 NOTE — BH Assessment (Signed)
Per Ophelia Shoulder, NP, patient is psych cleared. Patient ok to discharge. Dr. Fredderick Phenix agrees to rescind IVC.

## 2019-12-24 NOTE — Consult Note (Addendum)
  Seth Fuentes  29 y.o. male with a history significant for reported substance induced psychosis who originally presented to the ED under IVC for reports of SI, HI, AV hallucination in context of medication non compliance. Per chart review, patient has history of transient altered awareness 2/t marijuana at that time and improves short after metabolization which indicates substance induced psychosis as a likely diagnosis. Pt denies depression, anxiety, irritability, hallucinations, and is linear, coherent, with improved affect this morning compared to 2 days prior. Pt states he is amenable to receive OP substance use disorder treatment/resources. Pt admits that his substance use has been using marijuana, and understands its effects on his brain. Stressed with patient that each time he use illicit substances his psychosis can return. Each state of psychosis is detrimental to his brain cells, making the condition less likely reversible. He verbalizes understanding. Currently, patient lives alone in the [facility] and plans to go back to there.  Patient endorses good sleep and good appetite. Denies SI/HI/AVH. Denies side effects from medications. He reports he doesn't have any medications to take, and denies being prescribed Prozac.  No further concerns or questions for the treatment team at this time. Writer also attempted to contact mother at [phone] for Clayden Withem, however was also unable to reach her today or yesterday.At this juncture patient is being held here for sole purpose of observation as we try to contact his mother. We have been unsuccessful for the past two days in all attempts to reach mom, who originally initiated IVC. She has been called from different phone numbers and still no answer.  She has not shown up to the hospital since his ER admission. No further attempts will be made to contact mother, patient is an adult that is lucid, has no cognitive impairment and it is not exhibiting any  state of psychosis or paranoia in 48 hours.   Patient has not exhibited any signs of aggressive, overt disruptive behaviors, or agitation since 48 hours as well.  However due to inability to contact mother, staff can consider a welfare check to check on her. Patient is psychiatrically cleared and may be discharged home.

## 2019-12-24 NOTE — ED Notes (Signed)
Attempted to call patients' mother, Patsy Lager, with no response.

## 2019-12-24 NOTE — ED Provider Notes (Signed)
Patient has been cleared by TTS.  His IVC has been rescinded.  He is calm and cooperative.  He does not appear to have any hallucinations.  He is not exhibiting any SI or HI.  He was discharged home.  Return precautions were given.   Rolan Bucco, MD 12/24/19 [phone]
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
## 2019-12-24 NOTE — ED Notes (Signed)
Pt has been extremely compliant all night. He has slept for most of the night. Pt has been voicing his needs. This nurse has not had an issue with him for the duration of the night.

## 2019-12-24 NOTE — ED Provider Notes (Signed)
Emergency Medicine Observation Re-evaluation Note  Seth Fuentes is a 29 y.o. male, seen on rounds today.  Pt initially presented to the ED for complaints of Medical Clearance and IVC Currently, the patient is sleeping.  Physical Exam  BP 103/62   Pulse 62   Temp 98.2 F (36.8 C) (Oral)   Resp 18   SpO2 100%  Physical Exam General: NAD Cardiac: normal rate Lungs: no increased WOB Psych: sleeping  ED Course / MDM  EKG:    I have reviewed the labs performed to date as well as medications administered while in observation.  Recent changes in the last 24 hours include none.  Plan  Current plan is for awaiting placement.  Behavior has improved over last 24 hours, per psych notes, may be discharged from ED today Patient is under full IVC at this time.   Rolan Bucco, MD 12/24/19 1012

## 2020-06-10 DIAGNOSIS — F1994 Other psychoactive substance use, unspecified with psychoactive substance-induced mood disorder: Secondary | ICD-10-CM | POA: Insufficient documentation

## 2020-06-11 ENCOUNTER — Ambulatory Visit (HOSPITAL_COMMUNITY)
Admission: EM | Admit: 2020-06-11 | Discharge: 2020-06-11 | Disposition: A | Discharge status: Home / Self Care | Payer: No Payment, Other | Attending: Mental Health | Admitting: Mental Health

## 2020-06-11 DIAGNOSIS — F1994 Other psychoactive substance use, unspecified with psychoactive substance-induced mood disorder: Secondary | ICD-10-CM | POA: Diagnosis not present

## 2020-06-11 NOTE — ED Notes (Addendum)
This Clinical research associate presented patient with AVS paperwork and he refused to accept information.  Per patient, "I was supposed to be housed because people are trying to kill me". Second attempt made to offer patient AVS packet which included resource page.  Patient proceeded with disruptive behavior.  Patient stated, "get away from you because these dudes have sexual thoughts about me".  Security officer notified for assistance to escort patient out of building.

## 2020-06-11 NOTE — Discharge Instructions (Signed)
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
## 2020-06-11 NOTE — ED Provider Notes (Signed)
Behavioral Health Urgent Care Medical Screening Exam  Patient Name: Seth Fuentes MRN: [id number] Date of Evaluation: 06/11/20 Chief Complaint:   Diagnosis:  Final diagnoses:  Substance induced mood disorder (HCC)    History of Present illness: Seth Fuentes is a 30 y.o. male with a history of methamphetamine induced psychotic disorder, methadone use disorder, cocaine use disorder, and marijuana use disorder who presents to [facility] for an evaluation. One evaluation, patient was found sleeping in the assessment room. He was easily aroused. Patient reports that he is afraid to stay outside. When asked for clarification, he shrugged his shoulders and stated "I just feel afraid." When asked specifically about paranoia, patient denied. Patient denies auditory and visual hallucinations. He does not appear to be responding to internal stimuli. He denies suicidal ideations. He denies homicidal ideations. He denies use of marijuana, alcohol, cocaine, heroin, and methamphetamines.   Psychiatric Specialty Exam  Presentation  General Appearance:Disheveled  Eye Contact:Fair  Speech:Clear and Coherent; Normal Rate  Speech Volume:Normal  Handedness:Right   Mood and Affect  Mood:Depressed  Affect:Congruent   Thought Process  Thought Processes:Coherent  Descriptions of Associations:Intact  Orientation:Full (Time, Place and Person)  Thought Content:Logical    Hallucinations:None  Ideas of Reference:None  Suicidal Thoughts:No  Homicidal Thoughts:No   Sensorium  Memory:Immediate Good  Judgment:Fair  Insight:Fair   Executive Functions  Concentration:Fair  Attention Span:Fair  Recall:Fair  Fund of Knowledge:Fair  Language:Good   Psychomotor Activity  Psychomotor Activity:Normal   Assets  Assets:Desire for Improvement; Physical Health; Resilience   Sleep  Sleep:Fair  Number of hours: No data recorded  Nutritional Assessment (For OBS and FBC admissions only) Has  the patient had a weight loss or gain of 10 pounds or more in the last 3 months?: No Has the patient had a decrease in food intake/or appetite?: No Does the patient have dental problems?: No Does the patient have eating habits or behaviors that may be indicators of an eating disorder including binging or inducing vomiting?: No Has the patient recently lost weight without trying?: No Has the patient been eating poorly because of a decreased appetite?: No Malnutrition Screening Tool Score: 0    Physical Exam: Physical Exam Constitutional:      General: He is not in acute distress.    Appearance: He is not ill-appearing, toxic-appearing or diaphoretic.  HENT:     Head: Normocephalic.     Right Ear: External ear normal.     Left Ear: External ear normal.  Eyes:     Pupils: Pupils are equal, round, and reactive to light.  Cardiovascular:     Rate and Rhythm: Normal rate.  Pulmonary:     Effort: Pulmonary effort is normal. No respiratory distress.  Musculoskeletal:        General: Normal range of motion.  Skin:    General: Skin is warm and dry.  Neurological:     Mental Status: He is alert and oriented to person, place, and time.  Psychiatric:        Speech: Speech normal.        Behavior: Behavior is cooperative.        Thought Content: Thought content is not delusional. Thought content does not include homicidal or suicidal ideation. Thought content does not include suicidal plan.    Review of Systems  Constitutional: Negative for chills, diaphoresis, fever, malaise/fatigue and weight loss.  HENT: Negative for congestion.   Respiratory: Negative for cough and shortness of breath.   Cardiovascular: Negative for chest pain  and palpitations.  Gastrointestinal: Negative for diarrhea, nausea and vomiting.  Neurological: Negative for dizziness and seizures.  Psychiatric/Behavioral: Positive for depression and suicidal ideas. Negative for hallucinations, memory loss and substance  abuse. The patient is nervous/anxious and has insomnia.   All other systems reviewed and are negative.  Blood pressure 109/79, pulse 67, temperature 97.8 F (36.6 C), resp. rate 16, SpO2 100 %. There is no height or weight on file to calculate BMI.  Musculoskeletal: Strength & Muscle Tone: within normal limits Gait & Station: normal Patient leans: N/A   Suicide Risk:  Minimal: No identifiable suicidal ideation.  Patient may be identified as minimal risk based on the severity of the depressive symptoms and substance use.    [facility] MSE Discharge Disposition for Follow up and Recommendations: Based on my evaluation the patient does not appear to have an emergency medical condition and can be discharged with resources and follow up care in outpatient services for Medication Management and Individual Therapy   Patient provided with resources for homeless shelters and substance abuse treatment.   Jackelyn Poling, NP 06/11/2020, 3:24 AM

## 2020-06-12 ENCOUNTER — Telehealth (HOSPITAL_COMMUNITY): Payer: Self-pay | Admitting: General Practice

## 2020-06-12 NOTE — BH Assessment (Signed)
Care Management - Follow Up [facility] Discharges   Writer was not able to make contact with patient because the patient does not have a valid phone number listed in epic.

## 2021-07-06 ENCOUNTER — Emergency Department
Admission: EM | Admit: 2021-07-06 | Discharge: 2021-07-08 | Disposition: A | Discharge status: Other Acute Inpatient Hospital | Payer: 59 | Attending: Emergency Medicine | Admitting: Emergency Medicine

## 2021-07-06 DIAGNOSIS — F29 Unspecified psychosis not due to a substance or known physiological condition: Secondary | ICD-10-CM | POA: Insufficient documentation

## 2021-07-06 DIAGNOSIS — F1924 Other psychoactive substance dependence with psychoactive substance-induced mood disorder: Secondary | ICD-10-CM | POA: Insufficient documentation

## 2021-07-06 DIAGNOSIS — Z79899 Other long term (current) drug therapy: Secondary | ICD-10-CM | POA: Insufficient documentation

## 2021-07-06 DIAGNOSIS — F22 Delusional disorders: Secondary | ICD-10-CM | POA: Insufficient documentation

## 2021-07-06 DIAGNOSIS — Z20822 Contact with and (suspected) exposure to covid-19: Secondary | ICD-10-CM | POA: Insufficient documentation

## 2021-07-06 DIAGNOSIS — F1994 Other psychoactive substance use, unspecified with psychoactive substance-induced mood disorder: Secondary | ICD-10-CM | POA: Diagnosis present

## 2021-07-06 LAB — CBC WITH DIFFERENTIAL/PLATELET
Abs Immature Granulocytes: 0.02 10*3/uL (ref 0.00–0.07)
Basophils Absolute: 0.1 10*3/uL (ref 0.0–0.1)
Basophils Relative: 1 %
Eosinophils Absolute: 0.1 10*3/uL (ref 0.0–0.5)
Eosinophils Relative: 1 %
HCT: 38.4 % — ABNORMAL LOW (ref 39.0–52.0)
Hemoglobin: 12.9 g/dL — ABNORMAL LOW (ref 13.0–17.0)
Immature Granulocytes: 0 %
Lymphocytes Relative: 15 %
Lymphs Abs: 1.4 10*3/uL (ref 0.7–4.0)
MCH: 26.5 pg (ref 26.0–34.0)
MCHC: 33.6 g/dL (ref 30.0–36.0)
MCV: 79 fL — ABNORMAL LOW (ref 80.0–100.0)
Monocytes Absolute: 0.8 10*3/uL (ref 0.1–1.0)
Monocytes Relative: 9 %
Neutro Abs: 6.9 10*3/uL (ref 1.7–7.7)
Neutrophils Relative %: 74 %
Platelets: 270 10*3/uL (ref 150–400)
RBC: 4.86 MIL/uL (ref 4.22–5.81)
RDW: 13.7 % (ref 11.5–15.5)
WBC: 9.2 10*3/uL (ref 4.0–10.5)
nRBC: 0 % (ref 0.0–0.2)

## 2021-07-06 LAB — ACETAMINOPHEN LEVEL: Acetaminophen (Tylenol), Serum: <10 ug/mL — ABNORMAL LOW (ref 10–30)

## 2021-07-06 LAB — COMPREHENSIVE METABOLIC PANEL
ALT: 21 U/L (ref 0–44)
AST: 31 U/L (ref 15–41)
Albumin: 4.2 g/dL (ref 3.5–5.0)
Alkaline Phosphatase: 55 U/L (ref 38–126)
Anion gap: 10 (ref 5–15)
BUN: 15 mg/dL (ref 6–20)
CO2: 25 mmol/L (ref 22–32)
Calcium: 9.3 mg/dL (ref 8.9–10.3)
Chloride: 105 mmol/L (ref 98–111)
Creatinine, Ser: 1.1 mg/dL (ref 0.61–1.24)
GFR, Estimated: >60 mL/min (ref >60)
Glucose, Bld: 146 mg/dL — ABNORMAL HIGH (ref 70–99)
Potassium: 3.1 mmol/L — ABNORMAL LOW (ref 3.5–5.1)
Sodium: 140 mmol/L (ref 135–145)
Total Bilirubin: 0.6 mg/dL (ref 0.3–1.2)
Total Protein: 7.5 g/dL (ref 6.5–8.1)

## 2021-07-06 LAB — RESP PANEL BY RT-PCR (FLU A&B, COVID) ARPGX2
Influenza A by PCR: NEGATIVE
Influenza B by PCR: NEGATIVE
SARS Coronavirus 2 by RT PCR: NEGATIVE

## 2021-07-06 LAB — ETHANOL: Alcohol, Ethyl (B): <10 mg/dL (ref <10)

## 2021-07-06 LAB — SALICYLATE LEVEL: Salicylate Lvl: <7 mg/dL — ABNORMAL LOW (ref 7.0–30.0)

## 2021-07-06 MED ORDER — ZIPRASIDONE MESYLATE 20 MG IM SOLR: 20.0000 mg | Freq: Once | INTRAMUSCULAR | Status: AC

## 2021-07-06 MED ORDER — ZIPRASIDONE MESYLATE 20 MG IM SOLR
INTRAMUSCULAR | Status: AC
Start: 2021-07-06 — End: 2021-07-06
  Administered 2021-07-06: 20 mg via INTRAMUSCULAR
  Filled 2021-07-06: qty 20

## 2021-07-06 MED ORDER — OLANZAPINE 5 MG PO TBDP
10.0000 mg | ORAL_TABLET | Freq: Once | ORAL | Status: AC | PRN
  Administered 2021-07-07: 10 mg via ORAL
  Filled 2021-07-06: qty 2

## 2021-07-06 NOTE — ED Provider Notes (Signed)
? ?[facility] ?Provider Note ? ? Event Date/Time  ? First MD Initiated Contact with Patient 07/06/21 2030   ?  (approximate) ?History  ?Medical Clearance (IVC) ? ?HPI ?Seth Fuentes is a 31 y.o. male with a history of substance-induced psychosis who presents under involuntary commitment via law enforcement after he was found on top of a building causing property damage as well as telling police "I just want to be closer to God".  Patient currently having flight of ideas and difficult to redirect during interview.  Further history and review of systems are unable to be assessed at this time ?Physical Exam  ?Triage Vital Signs: ?ED Triage Vitals  ?Enc Vitals Group  ?   BP 07/06/21 2023 (!) 152/108  ?   Pulse Rate 07/06/21 2023 99  ?   Resp 07/06/21 2023 18  ?   Temp 07/06/21 2023 98.5 ?F (36.9 ?C)  ?   Temp Source 07/06/21 2023 Oral  ?   SpO2 07/06/21 2023 99 %  ?   Weight --   ?   Height --   ?   Head Circumference --   ?   Peak Flow --   ?   Pain Score 07/06/21 2005 0  ?   Pain Loc --   ?   Pain Edu? --   ?   Excl. in GC? --   ? ?Most recent vital signs: ?Vitals:  ? 07/06/21 2023  ?BP: (!) 152/108  ?Pulse: 99  ?Resp: 18  ?Temp: 98.5 ?F (36.9 ?C)  ?SpO2: 99%  ? ?General: Awake, alert ?CV:  Good peripheral perfusion.  ?Resp:  Normal effort.  ?Abd:  No distention.  ?Other:  Middle-aged African-American male standing and pacing in his room anxiously ?ED Results / Procedures / Treatments  ?Labs ?(all labs ordered are listed, but only abnormal results are displayed) ?Labs Reviewed  ?RESP PANEL BY RT-PCR (FLU A&B, COVID) [id number]  ? ?PROCEDURES: ?Critical Care performed: No ?Procedures ?MEDICATIONS ORDERED IN ED: ?Medications  ?OLANZapine zydis (ZYPREXA) disintegrating tablet 10 mg (10 mg Oral Not Given 07/06/21 2107)  ?ziprasidone (GEODON) injection 20 mg (20 mg Intramuscular Given 07/06/21 2109)  ? ?IMPRESSION / MDM / ASSESSMENT AND PLAN / ED COURSE  ?I reviewed the triage vital signs and the nursing  notes. ?             ?               ? ?Patient presents under IVC for hallucinations/delusions. Thoughts are disorganized. ?No history of prior suicide attempt, and no HI at this time. ?Clinically w/ no overt toxidrome, low suspicion for ingestion given hx and exam Thoughts unlikely 2/2 anemia, hypothyroidism, infection, or ICH. ?Patient?s decision making capacity is compromised and they are unable to perform all ADL?s (additionally they are without appropriate caretakers to assist through this deficit). ? ?Consult: Psychiatry to evaluate patient for grave disability ?Disposition: Pending psychiatric evaluation ? ?Care of this patient will be signed out the oncoming physician.  All pertinent patient formation is conveyed and all questions answered.  All further care and disposition decisions will be made by the oncoming physician. ? ?  ?FINAL CLINICAL IMPRESSION(S) / ED DIAGNOSES  ? ?Final diagnoses:  ?Psychosis, unspecified psychosis type (HCC)  ? ?Rx / DC Orders  ? ?ED Discharge Orders   ? ? None  ? ?  ? ?Note:  This document was prepared using Dragon voice recognition software and may include unintentional dictation errors. ?  ?  Merwyn Katos, MD ?07/06/21 2157 ? ?

## 2021-07-06 NOTE — ED Notes (Signed)
Two cups of po fluids and sandwich tray provided.  ?

## 2021-07-06 NOTE — ED Triage Notes (Signed)
PT arrives with [place] Fu[place] [employer]. Pt had access to top of a building in town where patient caused some damage to the property and was endorsing to officers "I want to get closer to god" pt was brought in for clearance and IVC ?

## 2021-07-06 NOTE — BH Assessment (Addendum)
This writer attempted to assess pt; however pt was nonsensical and unable to answer questions appropriately. Psych team to follow up.  ?

## 2021-07-06 NOTE — ED Notes (Signed)
Will recheck vital signs and get covid swab when pt less agitated.  ?

## 2021-07-06 NOTE — ED Notes (Signed)
Report to dawn, rn.  

## 2021-07-06 NOTE — ED Notes (Signed)
Pt speech incomprehensible. Pt unable to answer basic questions and unable to follow simple tasks at this time. Pt appears confused.  ?

## 2021-07-06 NOTE — ED Notes (Addendum)
Pat had neon yellow T-shirt, black shoes, grey socks, black pants with a belt, black sweatpants, grey underwear and a $2 a lighters and a charging cable was placed with his clothing.  ?

## 2021-07-07 DIAGNOSIS — F29 Unspecified psychosis not due to a substance or known physiological condition: Secondary | ICD-10-CM

## 2021-07-07 DIAGNOSIS — F1994 Other psychoactive substance use, unspecified with psychoactive substance-induced mood disorder: Secondary | ICD-10-CM | POA: Diagnosis not present

## 2021-07-07 LAB — URINE DRUG SCREEN, QUALITATIVE (ARMC ONLY)
Amphetamines, Ur Screen: NOT DETECTED
Barbiturates, Ur Screen: NOT DETECTED
Benzodiazepine, Ur Scrn: NOT DETECTED
Cannabinoid 50 Ng, Ur ~~LOC~~: POSITIVE — AB
Cocaine Metabolite,Ur ~~LOC~~: NOT DETECTED
MDMA (Ecstasy)Ur Screen: NOT DETECTED
Methadone Scn, Ur: NOT DETECTED
Opiate, Ur Screen: NOT DETECTED
Phencyclidine (PCP) Ur S: NOT DETECTED
Tricyclic, Ur Screen: NOT DETECTED

## 2021-07-07 MED ORDER — POTASSIUM CHLORIDE CRYS ER 20 MEQ PO TBCR
40.0000 meq | EXTENDED_RELEASE_TABLET | Freq: Two times a day (BID) | ORAL | Status: DC
  Administered 2021-07-07 – 2021-07-08 (×2): 40 meq via ORAL
  Filled 2021-07-07 (×3): qty 2

## 2021-07-07 MED ORDER — OLANZAPINE 5 MG PO TBDP
10.0000 mg | ORAL_TABLET | Freq: Every day | ORAL | Status: DC
  Filled 2021-07-07: qty 2

## 2021-07-07 NOTE — BH Assessment (Signed)
Referral Checks:   ? ?[facility] [phone]), ?  ?? [facility] [phone]), ?  ?? [facility] [phone] -[phone]), ?  ?? [facility] ([phone]), Facility contacted back and confirmed referral was received. Staff reports they will contact back if bed becomes available ? ?

## 2021-07-07 NOTE — ED Notes (Signed)
Pt refuses meds, states that he received them earlier today and was told he would not have to take any rest of time here. Pt educated on med administration and plan of care. Pt continues to refuse meds and states this is the issue when he is not here. States he is returning to sleep ?

## 2021-07-07 NOTE — BH Assessment (Signed)
Comprehensive Clinical Assessment (CCA) Note  07/07/2021 Seth Fuentes [id number]  Seth Fuentes, 31 year old male who presents to [facility] ED involuntarily for treatment. Per triage note,   During TTS assessment pt presents alert and oriented x 4, anxious but cooperative, and mood-congruent with affect. The pt does not appear to be responding to internal or external stimuli. Neither is the pt presenting with any delusional thinking. Pt verified the information provided to triage RN.   Pt identifies his main complaint to be that he was brought to the ED by police so he could be safe. Patient presents with illogical thinking stating that "people in the town are trying to kill me." Patient reports the people are trying to do magic on him and he does not like it. Patient reports the people are saying he is mentally ill and trying to scam him out of his inheritance that was left after his mom passed away. Patient states his aunt who lives in [place] is trying to get things situated and he would like to go live there. Patient reports he is homeless and hasn't be getting much rest. Patient is not currently taking any medication and refuses to do so. "I do not need medicine." Patient admits to marijuana use and denies all other illicit substances and alcohol. Pt reports having no INPT or OPT hx. Pt denies current SI/HI/AH/VH.    Per Seth Ober, NP, pt is recommended for inpatient psychiatric admission.    Chief Complaint:  Chief Complaint  Patient presents with   Medical Clearance    IVC   Visit Diagnosis: Psychosis    CCA Screening, Triage and Referral (STR)  Patient Reported Information How did you hear about [place]? -- Mudlogger)  Referral name: No data recorded Referral phone number: No data recorded  Whom do you see for routine medical problems? No data recorded Practice/Facility Name: No data recorded Practice/Facility Phone Number: No data recorded Name of Contact: No data  recorded Contact Number: No data recorded Contact Fax Number: No data recorded Prescriber Name: No data recorded Prescriber Address (if known): No data recorded  What Is the Reason for Your Visit/Call Today? Patient re ports he was brought in by the police to keep him safe.  How Long Has This Been Causing You Problems? <Week  What Do You Feel Would Help You the Most Today? -- (Assessment only)   Have You Recently Been in Any Inpatient Treatment (Hospital/Detox/Crisis Center/28-Day Program)? No data recorded Name/Location of Program/Hospital:No data recorded How Long Were You There? No data recorded When Were You Discharged? No data recorded  Have You Ever Received Services From [facility] Before? No data recorded Who Do You See at [facility]? No data recorded  Have You Recently Had Any Thoughts About Hurting Yourself? No  Are You Planning to Commit Suicide/Harm Yourself At This time? No   Have you Recently Had Thoughts About Hurting Someone Karolee Ohs? No  Explanation: No data recorded  Have You Used Any Alcohol or Drugs in the Past 24 Hours? Yes  How Long Ago Did You Use Drugs or Alcohol? No data recorded What Did You Use and How Much? Marijuana/unknown   Do You Currently Have a Therapist/Psychiatrist? No  Name of Therapist/Psychiatrist: No data recorded  Have You Been Recently Discharged From Any Office Practice or Programs? No  Explanation of Discharge From Practice/Program: No data recorded    CCA Screening Triage Referral Assessment Type of Contact: Face-to-Face  Is this Initial or Reassessment? No data recorded  Date Telepsych consult ordered in [facility]:  No data recorded Time Telepsych consult ordered in [facility]:  No data recorded  Patient Reported Information Reviewed? No data recorded Patient Left Without Being Seen? No data recorded Reason for Not Completing Assessment: No data recorded  Collateral Involvement: Silvana Newness- [phone]   Does Patient Have a  Court Appointed Legal Guardian? No data recorded Name and Contact of Legal Guardian: No data recorded If Minor and Not Living with Parent(s), Who has Custody? n/a  Is CPS involved or ever been involved? Never  Is APS involved or ever been involved? Never   Patient Determined To Be At Risk for Harm To Self or Others Based on Review of Patient Reported Information or Presenting Complaint? No  Method: No data recorded Availability of Means: No data recorded Intent: No data recorded Notification Required: No data recorded Additional Information for Danger to Others Potential: No data recorded Additional Comments for Danger to Others Potential: No data recorded Are There Guns or Other Weapons in Your Home? No data recorded Types of Guns/Weapons: No data recorded Are These Weapons Safely Secured?                            No data recorded Who Could Verify You Are Able To Have These Secured: No data recorded Do You Have any Outstanding Charges, Pending Court Dates, Parole/Probation? No data recorded Contacted To Inform of Risk of Harm To Self or Others: No data recorded  Location of Assessment: [facility] ED   Does Patient Present under Involuntary Commitment? Yes  IVC Papers Initial File Date: 07/07/21   [place] of Residence: [place]   Patient Currently Receiving the Following Services: Not Receiving Services   Determination of Need: Urgent (48 hours)   Options For Referral: Inpatient Hospitalization; ED Visit; Medication Management; Intensive Outpatient Therapy     CCA Biopsychosocial Intake/Chief Complaint:  No data recorded Current Symptoms/Problems: No data recorded  Patient Reported Schizophrenia/Schizoaffective Diagnosis in Past: No   Strengths: Patient able to verbalize needs and wants.  Preferences: No data recorded Abilities: No data recorded  Type of Services Patient Feels are Needed: No data recorded  Initial Clinical Notes/Concerns: No data  recorded  Mental Health Symptoms Depression:   Difficulty Concentrating; Sleep (too much or little)   Duration of Depressive symptoms: No data recorded  Mania:   None   Anxiety:    Worrying; Difficulty concentrating; Sleep   Psychosis:   Grossly disorganized speech; Grossly disorganized or catatonic behavior   Duration of Psychotic symptoms:  Greater than six months   Trauma:   None   Obsessions:   None   Compulsions:   None   Inattention:   None   Hyperactivity/Impulsivity:   N/A   Oppositional/Defiant Behaviors:   None   Emotional Irregularity:   Potentially harmful impulsivity   Other Mood/Personality Symptoms:  No data recorded   Mental Status Exam Appearance and self-care  Stature:   Average   Weight:   Average weight   Clothing:   Disheveled   Grooming:   Neglected   Cosmetic use:   None   Posture/gait:   Normal   Motor activity:   Not Remarkable   Sensorium  Attention:   Normal   Concentration:   Focuses on irrelevancies   Orientation:   X5   Recall/memory:  No data recorded  Affect and Mood  Affect:   Restricted   Mood:  No data recorded  Relating  Eye contact:   Normal   Facial expression:   Responsive   Attitude toward examiner:   Cooperative   Thought and Language  Speech flow:  Normal   Thought content:   Appropriate to Mood and Circumstances   Preoccupation:   Religion   Hallucinations:   None   Organization:  No data recorded  [employer] of Knowledge:   Average   Intelligence:   Average   Abstraction:   Functional   Judgement:   Fair   Reality Testing:   Distorted   Insight:  No data recorded  Decision Making:   Impulsive; Confused   Social Functioning  Social Maturity:   Impulsive   Social Judgement:   "Chief of Staff"   Stress  Stressors:   Housing; Illness; Transitions   Coping Ability:   Contractor Deficits:   Scientist, physiological;  Communication   Supports:   Support needed     Religion: Religion/Spirituality Are You A Religious Person?: Yes  Leisure/Recreation:    Exercise/Diet: Exercise/Diet Do You Have Any Trouble Sleeping?: Yes Explanation of Sleeping Difficulties: Patient reports he is not getting much sleep due to housing situation.   CCA Employment/Education Employment/Work Situation: Employment / Work Situation Employment Situation: Unemployed  Education:     CCA Family/Childhood History Family and Relationship History:    Childhood History:     Child/Adolescent Assessment:     CCA Substance Use Alcohol/Drug Use: Alcohol / Drug Use Pain Medications: See MAR Prescriptions: See MAR Over the Counter: See MAR History of alcohol / drug use?: Yes Longest period of sobriety (when/how long): Unknown Negative Consequences of Use: Financial Substance #1 Name of Substance 1: Marijuana 1 - Age of First Use: Unknown                       ASAM's:  Six Dimensions of Multidimensional Assessment  Dimension 1:  Acute Intoxication and/or Withdrawal Potential:   Dimension 1:  Description of individual's past and current experiences of substance use and withdrawal: 1  Dimension 2:  Biomedical Conditions and Complications:   Dimension 2:  Description of patient's biomedical conditions and  complications: 1  Dimension 3:  Emotional, Behavioral, or Cognitive Conditions and Complications:     Dimension 4:  Readiness to Change:     Dimension 5:  Relapse, Continued use, or Continued Problem Potential:     Dimension 6:  Recovery/Living Environment:     ASAM Severity Score:    ASAM Recommended Level of Treatment: ASAM Recommended Level of Treatment: Level II Partial Hospitalization Treatment   Substance use Disorder (SUD)    Recommendations for Services/Supports/Treatments: Recommendations for Services/Supports/Treatments Recommendations For Services/Supports/Treatments: Medication  Management, Individual Therapy  DSM5 Diagnoses: Patient Active Problem List   Diagnosis Date Noted   Psychosis (HCC) 07/07/2021   Substance induced mood disorder (HCC) 12/24/2019    Patient Centered Plan: Patient is on the following Treatment Plan(s):     Referrals to Alternative Service(s): Referred to Alternative Service(s):   Place:   Date:   Time:    Referred to Alternative Service(s):   Place:   Date:   Time:    Referred to Alternative Service(s):   Place:   Date:   Time:    Referred to Alternative Service(s):   Place:   Date:   Time:      @BHCOLLABOFCARE @  [place] R Theatre manager, Counselor, LCAS-A

## 2021-07-07 NOTE — ED Notes (Signed)
Encouraged patient to tidy room, provided trash can for patient to throw away any trash in patient room with staff supervision.  

## 2021-07-07 NOTE — BH Assessment (Signed)
This writer attempted to assess pt; however pt was nonsensical and unable to answer questions appropriately.  ?

## 2021-07-07 NOTE — ED Notes (Signed)
Breakfast tray placed at side. ?

## 2021-07-07 NOTE — BH Assessment (Signed)
Adult MH ° °Referral information for Psychiatric Hospitalization faxed to: ° °[facility] ° °· [facility] ([phone]-or- [phone]), ° °· [facility] ([phone]), ° °· [facility] ([phone] -or- [phone]), ° °· [facility] ([phone]), ° °· [facility] ([phone] or [phone]) ° °· [facility] ([phone] or [phone]), ° °· [facility] ([phone]) °

## 2021-07-07 NOTE — Consult Note (Signed)
[facility]  ? ?Reason for Consult:  irrational behavior ?Referring Physician:  EDP ?Patient Identification: Seth Fuentes ?MRN:  [id number] ?Principal Diagnosis: Psychosis (HCC) ?Diagnosis:  Principal Problem: ?  Psychosis (HCC) ?Active Problems: ?  Substance induced mood disorder (HCC) ? ? ?Total Time spent with patient: 45 minutes ? ?Subjective: "They brought me here to keep me safe." ?Seth MassonDonta Fuentes is a 31 y.o. male patient admitted with psychosis. On top of a building trying to "get closer to God." ? ?HPI:  Patient has hx of substance-induced mood disorder. UDS positive for cannabinoids only this visit.  Patient is calm, cooperative at time of evaluation.  He presents with some irrational thoughts.  He goes on about the "whole town wanting to kill me", but then says that they are really trying to teach him how to be safe to go to church."  He states that people are forcing "magic" on people.  Refers to people trying to make him be mentally ill.  He states that he has an aunt in [place] and something about a will of his mother who passed and getting money from that.  Patient denies any thoughts of self-harm/suicide.  Denies thoughts of wanting to harm anybody else.  He states that he has "visions of God", but only that he sees "God in the clouds."  He denies seeing a image of God.  Denies auditory hallucinations.  Patient denies illicit drug use, aside from cannabinoids.  Denies alcohol use.  BAL less than 10. ?Recommend inpatient psychiatric admission. ? ?Past Psychiatric History: substance-induced mood disorder [facility]. ? ?Risk to Self:   ?Risk to Others:   ?Prior Inpatient Therapy:   ?Prior Outpatient Therapy:   ? ?Past Medical History: No past medical history on file. No past surgical history on file. ?Family History: No family history on file. ?Family Psychiatric  History: None reported ?Social History:  ?Social History  ? ?Substance and Sexual Activity  ?Alcohol  Use Not Currently  ?   ?Social History  ? ?Substance and Sexual Activity  ?Drug Use Not Currently  ?  ?Social History  ? ?Socioeconomic History  ? Marital status: Single  ?  Spouse name: Not on file  ? Number of children: Not on file  ? Years of education: Not on file  ? Highest education level: Not on file  ?Occupational History  ? Not on file  ?Tobacco Use  ? Smoking status: Unknown  ? Smokeless tobacco: Not on file  ?Substance and Sexual Activity  ? Alcohol use: Not Currently  ? Drug use: Not Currently  ? Sexual activity: Not on file  ?Other Topics Concern  ? Not on file  ?Social History Narrative  ? Not on file  ? ?Social Determinants of Health  ? ?Financial Resource Strain: Not on file  ?Food Insecurity: Not on file  ?Transportation Needs: Not on file  ?Physical Activity: Not on file  ?Stress: Not on file  ?Social Connections: Not on file  ? ?Additional Social History: ?  ? ?Allergies:  No Known Allergies ? ?Labs:  ?Results for orders placed or performed during the hospital encounter of 07/06/21 (from the past 48 hour(s))  ?Urine Drug Screen, Qualitative     Status: Abnormal  ? Collection Time: 07/06/21  8:36 AM  ?Result Value Ref Range  ? Tricyclic, Ur Screen NONE DETECTED NONE DETECTED  ? Amphetamines, Ur Screen NONE DETECTED NONE DETECTED  ? MDMA (Ecstasy)Ur Screen NONE DETECTED NONE DETECTED  ?  Cocaine Metabolite,Ur [place] NONE DETECTED NONE DETECTED  ? Opiate, Ur Screen NONE DETECTED NONE DETECTED  ? Phencyclidine (PCP) Ur S NONE DETECTED NONE DETECTED  ? Cannabinoid 50 Ng, Ur [place] POSITIVE (A) NONE DETECTED  ? Barbiturates, Ur Screen NONE DETECTED NONE DETECTED  ? Benzodiazepine, Ur Scrn NONE DETECTED NONE DETECTED  ? Methadone Scn, Ur NONE DETECTED NONE DETECTED  ?  Comment: (NOTE) ?Tricyclics + metabolites, urine    Cutoff 1000 ng/mL ?Amphetamines + metabolites, urine  Cutoff 1000 ng/mL ?MDMA (Ecstasy), urine              Cutoff 500 ng/mL ?Cocaine Metabolite, urine          Cutoff 300 ng/mL ?Opiate +  metabolites, urine        Cutoff 300 ng/mL ?Phencyclidine (PCP), urine         Cutoff 25 ng/mL ?Cannabinoid, urine                 Cutoff 50 ng/mL ?Barbiturates + metabolites, urine  Cutoff 200 ng/mL ?Benzodiazepine, urine              Cutoff 200 ng/mL ?Methadone, urine                   Cutoff 300 ng/mL ? ?The urine drug screen provides only a preliminary, unconfirmed ?analytical test result and should not be used for non-medical ?purposes. Clinical consideration and professional judgment should ?be applied to any positive drug screen result due to possible ?interfering substances. A more specific alternate chemical method ?must be used in order to obtain a confirmed analytical result. ?Gas chromatography / mass spectrometry (GC/MS) is the preferred ?confirm atory method. ?Performed at [facility], [address] [place], ?[place] [address] ?  ?Comprehensive metabolic panel     Status: Abnormal  ? Collection Time: 07/06/21  8:20 PM  ?Result Value Ref Range  ? Sodium 140 135 - 145 mmol/L  ? Potassium 3.1 (L) 3.5 - 5.1 mmol/L  ? Chloride 105 98 - 111 mmol/L  ? CO2 25 22 - 32 mmol/L  ? Glucose, Bld 146 (H) 70 - 99 mg/dL  ?  Comment: Glucose reference range applies only to samples taken after fasting for at least 8 hours.  ? BUN 15 6 - 20 mg/dL  ? Creatinine, Ser 1.10 0.61 - 1.24 mg/dL  ? Calcium 9.3 8.9 - 10.3 mg/dL  ? Total Protein 7.5 6.5 - 8.1 g/dL  ? Albumin 4.2 3.5 - 5.0 g/dL  ? AST 31 15 - 41 U/L  ? ALT 21 0 - 44 U/L  ? Alkaline Phosphatase 55 38 - 126 U/L  ? Total Bilirubin 0.6 0.3 - 1.2 mg/dL  ? GFR, Estimated >60 >60 mL/min  ?  Comment: (NOTE) ?Calculated using the CKD-EPI Creatinine Equation (2021) ?  ? Anion gap 10 5 - 15  ?  Comment: Performed at [facility], [address] [place], [place] [address]  ?Ethanol     Status: None  ? Collection Time: 07/06/21  8:20 PM  ?Result Value Ref Range  ? Alcohol, Ethyl (B) <10 <10 mg/dL  ?  Comment: (NOTE) ?Lowest detectable limit for serum  alcohol is 10 mg/dL. ? ?For medical purposes only. ?Performed at [facility], [address] [place], ?[place] [address] ?  ?CBC with Diff     Status: Abnormal  ? Collection Time: 07/06/21  8:20 PM  ?Result Value Ref Range  ? WBC 9.2 4.0 - 10.5 K/uL  ?  RBC 4.86 4.22 - 5.81 MIL/uL  ? Hemoglobin 12.9 (L) 13.0 - 17.0 g/dL  ? HCT 38.4 (L) 39.0 - 52.0 %  ? MCV 79.0 (L) 80.0 - 100.0 fL  ? MCH 26.5 26.0 - 34.0 pg  ? MCHC 33.6 30.0 - 36.0 g/dL  ? RDW 13.7 11.5 - 15.5 %  ? Platelets 270 150 - 400 K/uL  ? nRBC 0.0 0.0 - 0.2 %  ? Neutrophils Relative % 74 %  ? Neutro Abs 6.9 1.7 - 7.7 K/uL  ? Lymphocytes Relative 15 %  ? Lymphs Abs 1.4 0.7 - 4.0 K/uL  ? Monocytes Relative 9 %  ? Monocytes Absolute 0.8 0.1 - 1.0 K/uL  ? Eosinophils Relative 1 %  ? Eosinophils Absolute 0.1 0.0 - 0.5 K/uL  ? Basophils Relative 1 %  ? Basophils Absolute 0.1 0.0 - 0.1 K/uL  ? Immature Granulocytes 0 %  ? Abs Immature Granulocytes 0.02 0.00 - 0.07 K/uL  ?  Comment: Performed at [facility], [address] [place], [place] [address]  ?Salicylate level     Status: Abnormal  ? Collection Time: 07/06/21  8:20 PM  ?Result Value Ref Range  ? Salicylate Lvl <7.0 (L) 7.0 - 30.0 mg/dL  ?  Comment: Performed at [facility], [address] [place] [address] [place], [place] [address]  ?Acetaminophen level     Status: Abnormal  ? Collection Time: 07/06/21  8:20 PM  ?Result Value Ref Range  ? Acetaminophen (Tylenol), Serum <10 (L) 10 - 30 ug/mL  ?  Comment: (NOTE) ?Therapeutic concentrations vary significantly. A range of 10-30 ug/mL  ?may be an effective concentration for many patients. However, some  ?are best treated at concentrations outside of this range. ?Acetaminophen concentrations >150 ug/mL at 4 hours after ingestion  ?and >50 ug/mL at 12 hours after ingestion are often associated with  ?toxic reactions. ? ?Performed at [facility], [address] [place], ?[place] [address] ?  ?Resp Panel by RT-PCR (Flu A&B, Covid)  Nasopharyngeal Swab     Status: None  ? Collection Time: 07/06/21  9:52 PM  ? Specimen: Nasopharyngeal Swab; Nasopharyngeal(NP) swabs in vial transport medium  ?Result Value Ref Range  ? SARS Coronavirus 2 by RT PCR NEGA

## 2021-07-07 NOTE — ED Notes (Signed)
Pt now awake, eating dinner and drinking sprite.  ?

## 2021-07-07 NOTE — ED Provider Notes (Signed)
Emergency Medicine Observation Re-evaluation Note ? ?Seth Fuentes is a 31 y.o. male, seen on rounds today.  Pt initially presented to the ED for complaints of Medical Clearance (IVC) ?Currently, the patient is resting. ? ?Physical Exam  ?BP (!) 152/108 (BP Location: Right Arm)   Pulse 99   Temp 98.5 ?F (36.9 ?C) (Oral)   Resp 18   SpO2 99%  ?Physical Exam ?Gen:  No acute distress ?Resp:  Breathing easily and comfortably, no accessory muscle usage ?Neuro:  Moving all four extremities, no gross focal neuro deficits ?Psych:  Resting currently, unable to participate very well in history when awake. ?ED Course / MDM  ?EKG:  ? ?I have reviewed the labs performed to date as well as medications administered while in observation.  Recent changes in the last 24 hours include initial EDP evaluation. ? ?Plan  ?Current plan is for psychiatric assessment and likely placement. ?Seth Fuentes is under involuntary commitment. ?  ? ?  ?Hinda Kehr, MD ?07/07/21 [phone] ? ?

## 2021-07-07 NOTE — ED Notes (Signed)
Patient transferred from ED to [facility] room 5 after screening for contraband. Report received from [place], RN including Situation, Background, Assessment and Recommendations. Pt oriented to unit including Q15 minute rounds as well as the security cameras for their protection. Patient is alert and oriented, warm and dry in no acute distress. Patient denies SI, HI, and AVH. Pt. Encouraged to let this nurse know if needs arise.  ?

## 2021-07-07 NOTE — ED Notes (Signed)
Pt is sleeping, will obtain vital signs when pt is awake ?

## 2021-07-07 NOTE — ED Notes (Signed)
Resumed care from ally rn.  Pt sleeping with covers pulled over head.   ?

## 2021-07-07 NOTE — ED Notes (Signed)
Mumbling to himself, inappropriately laughing, pacing around room. Pt back and forth between being aggravated and then cooperative with staff. ?

## 2021-07-07 NOTE — ED Notes (Signed)
Secure chat messaage sent to Dr. Quentin Cornwall due to pt refusing meds.  ?

## 2021-07-07 NOTE — ED Notes (Signed)
Obtained pt vital signs. ?

## 2021-07-08 ENCOUNTER — Encounter: Payer: Self-pay | Admitting: Psychiatry

## 2021-07-08 ENCOUNTER — Other Ambulatory Visit: Payer: Self-pay

## 2021-07-08 ENCOUNTER — Inpatient Hospital Stay
Admission: AD | Admit: 2021-07-08 | Discharge: 2021-07-14 | DRG: 885 | Disposition: A | Discharge status: Home / Self Care | Payer: 59 | Source: Intra-hospital | Attending: Psychiatry | Admitting: Psychiatry

## 2021-07-08 DIAGNOSIS — F32A Depression, unspecified: Secondary | ICD-10-CM | POA: Diagnosis present

## 2021-07-08 DIAGNOSIS — F1914 Other psychoactive substance abuse with psychoactive substance-induced mood disorder: Secondary | ICD-10-CM | POA: Diagnosis present

## 2021-07-08 DIAGNOSIS — F419 Anxiety disorder, unspecified: Secondary | ICD-10-CM | POA: Diagnosis present

## 2021-07-08 DIAGNOSIS — Z20822 Contact with and (suspected) exposure to covid-19: Secondary | ICD-10-CM | POA: Diagnosis present

## 2021-07-08 DIAGNOSIS — F203 Undifferentiated schizophrenia: Secondary | ICD-10-CM | POA: Diagnosis not present

## 2021-07-08 DIAGNOSIS — Z5900 Homelessness unspecified: Secondary | ICD-10-CM

## 2021-07-08 DIAGNOSIS — F1210 Cannabis abuse, uncomplicated: Secondary | ICD-10-CM | POA: Diagnosis present

## 2021-07-08 DIAGNOSIS — F1514 Other stimulant abuse with stimulant-induced mood disorder: Secondary | ICD-10-CM | POA: Diagnosis present

## 2021-07-08 DIAGNOSIS — F1994 Other psychoactive substance use, unspecified with psychoactive substance-induced mood disorder: Secondary | ICD-10-CM | POA: Diagnosis present

## 2021-07-08 DIAGNOSIS — F209 Schizophrenia, unspecified: Principal | ICD-10-CM | POA: Diagnosis present

## 2021-07-08 MED ORDER — MAGNESIUM HYDROXIDE 400 MG/5ML PO SUSP: 30.0000 mL | Freq: Every day | ORAL | Status: DC | PRN

## 2021-07-08 MED ORDER — ACETAMINOPHEN 325 MG PO TABS
650.0000 mg | ORAL_TABLET | Freq: Four times a day (QID) | ORAL | Status: DC | PRN
Start: 2021-07-08 — End: 2021-07-14

## 2021-07-08 MED ORDER — OLANZAPINE 10 MG PO TBDP: 10.0000 mg | ORAL_TABLET | Freq: Every day | ORAL | Status: DC

## 2021-07-08 MED ORDER — ALUM & MAG HYDROXIDE-SIMETH 200-200-20 MG/5ML PO SUSP
30.0000 mL | ORAL | Status: DC | PRN
Start: 2021-07-08 — End: 2021-07-14

## 2021-07-08 MED ORDER — BENZTROPINE MESYLATE 1 MG PO TABS
1.0000 mg | ORAL_TABLET | Freq: Two times a day (BID) | ORAL | Status: DC
  Administered 2021-07-08 – 2021-07-14 (×12): 1 mg via ORAL
  Filled 2021-07-08 (×12): qty 1

## 2021-07-08 MED ORDER — POTASSIUM CHLORIDE CRYS ER 20 MEQ PO TBCR
40.0000 meq | EXTENDED_RELEASE_TABLET | Freq: Two times a day (BID) | ORAL | Status: AC
  Administered 2021-07-08: 40 meq via ORAL
  Filled 2021-07-08: qty 2

## 2021-07-08 MED ORDER — HYDROXYZINE HCL 50 MG PO TABS
50.0000 mg | ORAL_TABLET | Freq: Three times a day (TID) | ORAL | Status: DC | PRN
Start: 2021-07-08 — End: 2021-07-14

## 2021-07-08 MED ORDER — HALOPERIDOL 5 MG PO TABS
5.0000 mg | ORAL_TABLET | Freq: Two times a day (BID) | ORAL | Status: DC
  Administered 2021-07-08 – 2021-07-14 (×12): 5 mg via ORAL
  Filled 2021-07-08 (×12): qty 1

## 2021-07-08 NOTE — Tx Team (Signed)
Initial Treatment Plan ?07/08/2021 ?3:11 PM ?[name] ?[id number] ? ? ? ?PATIENT STRESSORS: ?Financial difficulties   ?Loss of Mother   ?Medication change or noncompliance   ? ? ?PATIENT STRENGTHS: ?General fund of knowledge  ?Religious Affiliation  ?Supportive family/friends  ? ? ?PATIENT IDENTIFIED PROBLEMS: ?Medication Noncompliance  ?Psychosis  ?Homeless  ?  ?  ?  ?  ?  ?  ?  ? ?DISCHARGE CRITERIA:  ?Ability to meet basic life and health needs ?Improved stabilization in mood, thinking, and/or behavior ?Motivation to continue treatment in a less acute level of care ?Need for constant or close observation no longer present ?Reduction of life-threatening or endangering symptoms to within safe limits ? ?PRELIMINARY DISCHARGE PLAN: ?Outpatient therapy ?Return to previous work or school arrangements ? ?PATIENT/FAMILY INVOLVEMENT: ?This treatment plan has been presented to and reviewed with the patient, Seth Fuentes. The patient has been given the opportunity to ask questions and make suggestions. ? ?Doyce Para, RN ?07/08/2021, 3:11 PM ?

## 2021-07-08 NOTE — ED Notes (Signed)
Report received from Christopher B, RN including SBAR. On initial round after report Pt is warm/dry, resting quietly in room without any s/s of distress.  Will continue to monitor throughout shift as ordered for any changes in behaviors and for continued safety.   

## 2021-07-08 NOTE — [facility] Suicide Risk Assessment (Signed)
[facility] Admission Suicide Risk Assessment ? ? ?Nursing information obtained from:    ?Demographic factors:    ?Current Mental Status:    ?Loss Factors:    ?Historical Factors:    ?Risk Reduction Factors:    ? ?Total Time spent with patient: 1 hour ?Principal Problem: Schizophrenia (HCC) ?Diagnosis:  Principal Problem: ?  Schizophrenia (HCC) ?Active Problems: ?  Substance induced mood disorder (HCC) ? ?Subjective Data: Patient seen and chart reviewed.  31 year old man with a history of recurrent psychotic episodes came to the emergency room under IVC after reportedly climbing on the roof of a building and damaging property and acting bizarre.  Patient did not report any suicidal ideation.  On interview today patient has a somewhat euphoric affect and is very talkative and loose but denies suicidal thought.  Not acting out in an aggressive or self-injuring way.  He indicates likely cooperation with medication treatment ? ?Continued Clinical Symptoms:  ?  ?The "Alcohol Use Disorders Identification Test", Guidelines for Use in Primary Care, Second Edition.  World Science writer [facility]). ?Score between 0-7:  no or low risk or alcohol related problems. ?Score between 8-15:  moderate risk of alcohol related problems. ?Score between 16-19:  high risk of alcohol related problems. ?Score 20 or above:  warrants further diagnostic evaluation for alcohol dependence and treatment. ? ? ?CLINICAL FACTORS:  ? Alcohol/Substance Abuse/Dependencies ?Schizophrenia:   Less than 7 years old ? ? ?Musculoskeletal: ?Strength & Muscle Tone: within normal limits ?Gait & Station: normal ?Patient leans: N/A ? ?Psychiatric Specialty Exam: ? ?Presentation  ?General Appearance: Appropriate for Environment ? ?Eye Contact:Good ? ?Speech:Clear and Coherent ? ?Speech Volume:Normal ? ?Handedness:No data recorded ? ?Mood and Affect  ?Mood:Dysphoric ? ?Affect:Blunt; Congruent ? ? ?Thought Process  ?Thought Processes:Disorganized ? ?Descriptions of  Associations:Loose ? ?Orientation:Partial ? ?Thought Content:Delusions ? ?History of Schizophrenia/Schizoaffective disorder:No ? ?Duration of Psychotic Symptoms:Greater than six months ? ?Hallucinations:Hallucinations: -- (denies) ? ?Ideas of Reference:Delusions; Paranoia ? ?Suicidal Thoughts:Suicidal Thoughts: No ? ?Homicidal Thoughts:Homicidal Thoughts: No ? ? ?Sensorium  ?Memory:Immediate Fair ? ?Judgment:Impaired ? ?Insight:Poor ? ? ?Executive Functions  ?Concentration:Fair ? ?Attention Span:Fair ? ?Recall:Fair ? ?Fund of Knowledge:Fair ? ?Language:Fair ? ? ?Psychomotor Activity  ?Psychomotor Activity:Psychomotor Activity: Normal ? ? ?Assets  ?Assets:Resilience ? ? ?Sleep  ?Sleep:Sleep: Poor ? ? ? ?Physical Exam: ?Physical Exam ?Vitals and nursing note reviewed.  ?Constitutional:   ?   Appearance: Normal appearance.  ?HENT:  ?   Head: Normocephalic and atraumatic.  ?   Mouth/Throat:  ?   Pharynx: Oropharynx is clear.  ?Eyes:  ?   Pupils: Pupils are equal, round, and reactive to light.  ?Cardiovascular:  ?   Rate and Rhythm: Normal rate and regular rhythm.  ?Pulmonary:  ?   Effort: Pulmonary effort is normal.  ?   Breath sounds: Normal breath sounds.  ?Abdominal:  ?   General: Abdomen is flat.  ?   Palpations: Abdomen is soft.  ?Musculoskeletal:     ?   General: Normal range of motion.  ?Skin: ?   General: Skin is warm and dry.  ?Neurological:  ?   General: No focal deficit present.  ?   Mental Status: He is alert. Mental status is at baseline.  ?Psychiatric:     ?   Attention and Perception: Attention normal.     ?   Mood and Affect: Mood normal. Affect is labile and inappropriate.     ?   Speech: Speech is rapid and pressured and tangential.     ?  Behavior: Behavior is agitated. Behavior is not aggressive.     ?   Thought Content: Thought content is paranoid and delusional. Thought content does not include homicidal or suicidal ideation.     ?   Cognition and Memory: Cognition is impaired. Memory is impaired.      ?   Judgment: Judgment is inappropriate.  ? ?Review of Systems  ?Constitutional: Negative.   ?HENT: Negative.    ?Eyes: Negative.   ?Respiratory: Negative.    ?Cardiovascular: Negative.   ?Gastrointestinal: Negative.   ?Musculoskeletal: Negative.   ?Skin: Negative.   ?Neurological: Negative.   ?Psychiatric/Behavioral:  Positive for hallucinations and substance abuse. Negative for depression, memory loss and suicidal ideas. The patient is nervous/anxious. The patient does not have insomnia.   ?Blood pressure 132/70, pulse 72, temperature 98.2 ?F (36.8 ?C), temperature source Oral, resp. rate 18, weight 61.2 kg, SpO2 100 %. Body mass index is 16.87 kg/m?. ? ? ?COGNITIVE FEATURES THAT CONTRIBUTE TO RISK:  ?Thought constriction (tunnel vision)   ? ?SUICIDE RISK:  ? Minimal: No identifiable suicidal ideation.  Patients presenting with no risk factors but with morbid ruminations; may be classified as minimal risk based on the severity of the depressive symptoms ? ?PLAN OF CARE: Continue individual and group assessment.  Treatment team will meet with him tomorrow.  Start medication for psychosis.  Continue 15-minute checks.  Ongoing assessment of dangerousness prior to discharge ? ?I certify that inpatient services furnished can reasonably be expected to improve the patient's condition.  ? ?Mordecai Rasmussen, MD ?07/08/2021, 3:31 PM ? ?

## 2021-07-08 NOTE — ED Notes (Signed)
Pt is A/O x3 @ discharge.  All belongings accounted for and sent with pt to BMU unit downstairs.  Pt ambulatory however transported via wheelchair w staff x1 and security x1 ?

## 2021-07-08 NOTE — Group Note (Signed)
LCSW Group Therapy Note ? ?Group Date: 07/08/2021 ?Start Time: 1300 ?End Time: 1400 ? ? ?Type of Therapy and Topic:  Group Therapy - How To Cope with Nervousness about Discharge  ? ?Participation Level:  Did Not Attend  ? ?Description of Group ?This process group involved identification of patients' feelings about discharge. Some of them are scheduled to be discharged soon, while others are new admissions, but each of them was asked to share thoughts and feelings surrounding discharge from the hospital. One common theme was that they are excited at the prospect of going home, while another was that many of them are apprehensive about sharing why they were hospitalized. Patients were given the opportunity to discuss these feelings with their peers in preparation for discharge. ? ?Therapeutic Goals ? ?Patient will identify their overall feelings about pending discharge. ?Patient will think about how they might proactively address issues that they believe will once again arise once they get home (i.e. with parents). ?Patients will participate in discussion about having hope for change. ? ? ?Summary of Patient Progress:  Patient not yet admitted to unit at time of group. Patient will be encouraged to participate in future groups.  ? ? ?Therapeutic Modalities ?Cognitive Behavioral Therapy ? ? ?Corky Crafts, LCSWA ?07/08/2021  2:30 PM   ?

## 2021-07-08 NOTE — Progress Notes (Addendum)
Pt denies depression, anxiety, SI, HI and AVH. Pt is very disorganized during the interview. He never mentioned being at a group home but stated that he has been homeless for two to three years since his mother died. He denies using tobacco and ETOH. He said that his mother taught him "magic". He has flights of ideas and many delusions. Pt was oriented to the unit and educated on the care plan.  ?Torrie Mayers RN ?

## 2021-07-08 NOTE — ED Notes (Signed)
Pt. Is currently asleep, will obtain vitals when awake.  ?

## 2021-07-08 NOTE — Progress Notes (Signed)
Patient calm and cooperative during assessment denying SI/HI/AVH. Pt presents disorganized. Pt observed interacting appropriately with staff and peers on the unit. Pt didn't have any medications scheduled tonight and hasn't requested anything PRN. Pt given education, support, and encouragement to be active in his treatment plan. Pt being monitored Q 15 minutes for safety per unit protocol. Pt remains safe on the unit.  ?

## 2021-07-08 NOTE — H&P (Signed)
Psychiatric Admission Assessment Adult ? ?Patient Identification: Seth Fuentes ?MRN:  [id number] ?Date of Evaluation:  07/08/2021 ?Chief Complaint:  Schizophrenia (HCC) [F20.9] ?Principal Diagnosis: Schizophrenia (HCC) ?Diagnosis:  Principal Problem: ?  Schizophrenia (HCC) ?Active Problems: ?  Substance induced mood disorder (HCC) ? ?History of Present Illness: Patient seen and chart reviewed.  31 year old man brought to the emergency room under IVC filed by Patent examiner.  They reported that the patient was on a roof of a building where he was acting bizarrely and damaging property.  When police approached him he reportedly was grabbing onto a large concrete brick and talking about how he was trying to get closer to God.  When brought to the emergency room he was initially very disorganized and could not be interviewed.  Today the patient remains disorganized but not as badly.  He was cooperative with the conversation.  Patient rambles and makes very little sense.  He talks a lot about court hearings and about how he is not on probation anymore.  He seems to indicate that he has been living outdoors and just got out of jail about a month ago.  Receiving a outpatient psychiatric treatment.  He claims that he used a Vapne pen a couple of times but otherwise has not used any drugs since getting out of jail.  He denies suicidal or homicidal ideation.  Patient at first denied hallucinations but then went on in his conversation to mention hearing things all around him. ?Associated Signs/Symptoms: ?Depression Symptoms:  impaired memory, ?Duration of Depression Symptoms: No data recorded ?(Hypo) Manic Symptoms:  Elevated Mood, ?Flight of Ideas, ?Hallucinations, ?Impulsivity, ?Irritable Mood, ?Labiality of Mood, ?Anxiety Symptoms:  Excessive Worry, ?Psychotic Symptoms:  Delusions, ?Hallucinations: Auditory ?Paranoia, ?PTSD Symptoms: ?Negative ?Total Time spent with patient: 1 hour ? ?Past Psychiatric History: Patient has  had multiple visits to our hospital as well as others in the area to the emergency room with psychotic symptoms.  He has had at least a couple of hospitalizations and been treated with olanzapine other times he has been released after his psychotic symptoms seem to spontaneously resolve.  He has been in our emergency room for a couple of days now without any resolution of his psychosis.  In the past has often thought to have substance-induced psychotic symptoms although it is not clear this is the correct diagnosis.  He has never followed up with any outpatient treatment.  Denies any history of self-harm or violence ? ?Is the patient at risk to self? Yes.    ?Has the patient been a risk to self in the past 6 months? Yes.    ?Has the patient been a risk to self within the distant past? Yes.    ?Is the patient a risk to others? Yes.    ?Has the patient been a risk to others in the past 6 months? No.  ?Has the patient been a risk to others within the distant past? No.  ? ?Prior Inpatient Therapy:   ?Prior Outpatient Therapy:   ? ?Alcohol Screening:   ?Substance Abuse History in the last 12 months:  Yes.   ?Consequences of Substance Abuse: ?Cannabis abuse in the past has thought to be involved in his symptoms ?Previous Psychotropic Medications: Yes  ?Psychological Evaluations: Yes  ?Past Medical History: No past medical history on file. No past surgical history on file. ?Family History: No family history on file. ?Family Psychiatric  History: He said he had 1 uncle who had a mental illness but he does  not know anything else about it ?Tobacco Screening:   ?Social History:  ?Social History  ? ?Substance and Sexual Activity  ?Alcohol Use Not Currently  ?   ?Social History  ? ?Substance and Sexual Activity  ?Drug Use Not Currently  ?  ?Additional Social History: ?  ?   ?  ?  ?  ?  ?  ?  ?  ?  ?  ?  ? ?Allergies:  No Known Allergies ?Lab Results:  ?Results for orders placed or performed during the hospital encounter of  07/06/21 (from the past 48 hour(s))  ?Comprehensive metabolic panel     Status: Abnormal  ? Collection Time: 07/06/21  8:20 PM  ?Result Value Ref Range  ? Sodium 140 135 - 145 mmol/L  ? Potassium 3.1 (L) 3.5 - 5.1 mmol/L  ? Chloride 105 98 - 111 mmol/L  ? CO2 25 22 - 32 mmol/L  ? Glucose, Bld 146 (H) 70 - 99 mg/dL  ?  Comment: Glucose reference range applies only to samples taken after fasting for at least 8 hours.  ? BUN 15 6 - 20 mg/dL  ? Creatinine, Ser 1.10 0.61 - 1.24 mg/dL  ? Calcium 9.3 8.9 - 10.3 mg/dL  ? Total Protein 7.5 6.5 - 8.1 g/dL  ? Albumin 4.2 3.5 - 5.0 g/dL  ? AST 31 15 - 41 U/L  ? ALT 21 0 - 44 U/L  ? Alkaline Phosphatase 55 38 - 126 U/L  ? Total Bilirubin 0.6 0.3 - 1.2 mg/dL  ? GFR, Estimated >60 >60 mL/min  ?  Comment: (NOTE) ?Calculated using the CKD-EPI Creatinine Equation (2021) ?  ? Anion gap 10 5 - 15  ?  Comment: Performed at [facility], [address] [place], [place] [address]  ?Ethanol     Status: None  ? Collection Time: 07/06/21  8:20 PM  ?Result Value Ref Range  ? Alcohol, Ethyl (B) <10 <10 mg/dL  ?  Comment: (NOTE) ?Lowest detectable limit for serum alcohol is 10 mg/dL. ? ?For medical purposes only. ?Performed at [facility], [address] [place], ?[place] [address] ?  ?CBC with Diff     Status: Abnormal  ? Collection Time: 07/06/21  8:20 PM  ?Result Value Ref Range  ? WBC 9.2 4.0 - 10.5 K/uL  ? RBC 4.86 4.22 - 5.81 MIL/uL  ? Hemoglobin 12.9 (L) 13.0 - 17.0 g/dL  ? HCT 38.4 (L) 39.0 - 52.0 %  ? MCV 79.0 (L) 80.0 - 100.0 fL  ? MCH 26.5 26.0 - 34.0 pg  ? MCHC 33.6 30.0 - 36.0 g/dL  ? RDW 13.7 11.5 - 15.5 %  ? Platelets 270 150 - 400 K/uL  ? nRBC 0.0 0.0 - 0.2 %  ? Neutrophils Relative % 74 %  ? Neutro Abs 6.9 1.7 - 7.7 K/uL  ? Lymphocytes Relative 15 %  ? Lymphs Abs 1.4 0.7 - 4.0 K/uL  ? Monocytes Relative 9 %  ? Monocytes Absolute 0.8 0.1 - 1.0 K/uL  ? Eosinophils Relative 1 %  ? Eosinophils Absolute 0.1 0.0 - 0.5 K/uL  ? Basophils Relative 1 %  ? Basophils  Absolute 0.1 0.0 - 0.1 K/uL  ? Immature Granulocytes 0 %  ? Abs Immature Granulocytes 0.02 0.00 - 0.07 K/uL  ?  Comment: Performed at [facility], [address] [place], [place] [address]  ?Salicylate level     Status: Abnormal  ? Collection Time: 07/06/21  8:20 PM  ?  Result Value Ref Range  ? Salicylate Lvl <7.0 (L) 7.0 - 30.0 mg/dL  ?  Comment: Performed at [facility], [address] [place], [place] [address]  ?Acetaminophen level     Status: Abnormal  ? Collection Time: 07/06/21  8:20 PM  ?Result Value Ref Range  ? Acetaminophen (Tylenol), Serum <10 (L) 10 - 30 ug/mL  ?  Comment: (NOTE) ?Therapeutic concentrations vary significantly. A range of 10-30 ug/mL  ?may be an effective concentration for many patients. However, some  ?are best treated at concentrations outside of this range. ?Acetaminophen concentrations >150 ug/mL at 4 hours after ingestion  ?and >50 ug/mL at 12 hours after ingestion are often associated with  ?toxic reactions. ? ?Performed at [facility], [address] [place], ?[place] [address] ?  ?Resp Panel by RT-PCR (Flu A&B, Covid) Nasopharyngeal Swab     Status: None  ? Collection Time: 07/06/21  9:52 PM  ? Specimen: Nasopharyngeal Swab; Nasopharyngeal(NP) swabs in vial transport medium  ?Result Value Ref Range  ? SARS Coronavirus 2 by RT PCR NEGATIVE NEGATIVE  ?  Comment: (NOTE) ?SARS-CoV-2 target nucleic acids are NOT DETECTED. ? ?The SARS-CoV-2 RNA is generally detectable in upper respiratory ?specimens during the acute phase of infection. The lowest ?concentration of SARS-CoV-2 viral copies this assay can detect is ?138 copies/mL. A negative result does not preclude SARS-Cov-2 ?infection and should not be used as the sole basis for treatment or ?other patient management decisions. A negative result may occur with  ?improper specimen collection/handling, submission of specimen other ?than nasopharyngeal swab, presence of viral mutation(s) within  the ?areas targeted by this assay, and inadequate number of viral ?copies(<138 copies/mL). A negative result must be combined with ?clinical observations, patient history, and epidemiological ?information. The expe

## 2021-07-08 NOTE — Plan of Care (Signed)
New admission. ? ?Problem: Education: ?Goal: Knowledge of [facility] Education information/materials will improve ?Outcome: Not Progressing ?Goal: Emotional status will improve ?Outcome: Not Progressing ?Goal: Mental status will improve ?Outcome: Not Progressing ?Goal: Verbalization of understanding the information provided will improve ?Outcome: Not Progressing ?  ?Problem: Safety: ?Goal: Periods of time without injury will increase ?Outcome: Not Progressing ?  ?Problem: Education: ?Goal: Ability to make informed decisions regarding treatment will improve ?Outcome: Not Progressing ?  ?Problem: Coping: ?Goal: Coping ability will improve ?Outcome: Not Progressing ?  ?Problem: Self-Concept: ?Goal: Ability to disclose and discuss suicidal ideas will improve ?Outcome: Not Progressing ?Goal: Will verbalize positive feelings about self ?Outcome: Not Progressing ?  ?Problem: Education: ?Goal: Will be free of psychotic symptoms ?Outcome: Not Progressing ?Goal: Knowledge of the prescribed therapeutic regimen will improve ?Outcome: Not Progressing ?  ?Problem: Role Relationship: ?Goal: Ability to interact with others will improve ?Outcome: Not Progressing ?  ?

## 2021-07-08 NOTE — ED Provider Notes (Signed)
Emergency Medicine Observation Re-evaluation Note ? ?Seth Fuentes is a 31 y.o. male, seen on rounds today.  Pt initially presented to the ED for complaints of Medical Clearance (IVC) ?Currently, the patient is calm, resting. ? ?Physical Exam  ?BP 116/70 (BP Location: Left Arm)   Pulse 83   Temp 97.8 ?F (36.6 ?C)   Resp 18   SpO2 98%  ? ? ?ED Course / MDM  ?EKG:  ? ?I have reviewed the labs performed to date as well as medications administered while in observation.  Recent changes in the last 24 hours include none. ? ?Plan  ?Current plan is for psych admission. ?Jamaal Bernasconi is under involuntary commitment. ?  ? ?  ?Shaune Pollack, MD ?07/08/21 [phone] ? ?

## 2021-07-08 NOTE — ED Notes (Signed)
Hospital meal provided, pt tolerated w/o complaints.  Waste discarded appropriately.  

## 2021-07-08 NOTE — ED Notes (Addendum)
Unlocked bathroom door to allow patient to shower.  Staff continuously monitored dayroom outside of bathroom door during pt shower.  Pt was given hygiene items as well as:  I clean top, 1 clean bottom, with 1 pair of disposable underwear.  Pt changed out into clean clothing.  Staff disposed of all shower supplies. Shower room cleaned and secured for next use.  

## 2021-07-08 NOTE — BH Assessment (Addendum)
Patient is to be admitted to [facility] today 07/08/21 at 2:00pm by Dr. Toni Amend.  ?Attending Physician will be Dr.  Toni Amend .   ?Patient has been assigned to room 307, by [facility] Charge Nurse, Reily Treloar.   ? ?ER staff is aware of the admission: ?Luann, ER Secretary   ?Dr. Erma Heritage, ER MD  ?Florentina Addison, Patient's Nurse  ?Sue Lush Patient Access.  ?

## 2021-07-08 NOTE — ED Notes (Signed)
IVC/Pending Placement 

## 2021-07-08 NOTE — BH Assessment (Signed)
Patient is under review at [facility]. Awaiting accepting details.  °

## 2021-07-08 NOTE — ED Notes (Signed)
Medication(s) given per order.  Pt educated on medication(s) and their action and dosage.   Pt denies any additional questions or concerns.    ?

## 2021-07-09 ENCOUNTER — Other Ambulatory Visit: Payer: Self-pay

## 2021-07-09 DIAGNOSIS — F203 Undifferentiated schizophrenia: Secondary | ICD-10-CM | POA: Diagnosis not present

## 2021-07-09 LAB — LIPID PANEL
Cholesterol: 162 mg/dL (ref 0–200)
HDL: 63 mg/dL (ref >40)
LDL Cholesterol: 81 mg/dL (ref 0–99)
Total CHOL/HDL Ratio: 2.6 RATIO
Triglycerides: 91 mg/dL (ref <150)
VLDL: 18 mg/dL (ref 0–40)

## 2021-07-09 LAB — HEMOGLOBIN A1C
Hgb A1c MFr Bld: 5.4 % (ref 4.8–5.6)
Mean Plasma Glucose: 108.28 mg/dL

## 2021-07-09 MED ORDER — BENZTROPINE MESYLATE 0.5 MG PO TABS
1.0000 mg | ORAL_TABLET | Freq: Two times a day (BID) | ORAL | 0 refills | Status: DC
  Filled 2021-07-09: qty 40, 10d supply, fill #0

## 2021-07-09 MED ORDER — HYDROXYZINE HCL 50 MG PO TABS
50.0000 mg | ORAL_TABLET | Freq: Three times a day (TID) | ORAL | 0 refills | Status: DC | PRN
  Filled 2021-07-09: qty 20, 7d supply, fill #0

## 2021-07-09 MED ORDER — HALOPERIDOL 5 MG PO TABS
5.0000 mg | ORAL_TABLET | Freq: Two times a day (BID) | ORAL | 0 refills | Status: DC
  Filled 2021-07-09: qty 20, 10d supply, fill #0
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
## 2021-07-09 NOTE — BH IP Treatment Plan (Signed)
Interdisciplinary Treatment and Diagnostic Plan Update ? ?07/09/2021 ?Time of Session: 0930 ?Seth Fuentes ?MRN: [id number] ? ?Principal Diagnosis: Schizophrenia (HCC) ? ?Secondary Diagnoses: Principal Problem: ?  Schizophrenia (HCC) ?Active Problems: ?  Substance induced mood disorder (HCC) ? ? ?Current Medications:  ?Current Facility-Administered Medications  ?Medication Dose Route Frequency Provider Last Rate Last Admin  ? acetaminophen (TYLENOL) tablet 650 mg  650 mg Oral Q6H PRN Clapacs, John T, MD      ? alum & mag hydroxide-simeth (MAALOX/MYLANTA) 200-200-20 MG/5ML suspension 30 mL  30 mL Oral Q4H PRN Clapacs, John T, MD      ? benztropine (COGENTIN) tablet 1 mg  1 mg Oral BID Clapacs, Jackquline Denmark, MD   1 mg at 07/09/21 0801  ? haloperidol (HALDOL) tablet 5 mg  5 mg Oral BID Clapacs, Jackquline Denmark, MD   5 mg at 07/09/21 0801  ? hydrOXYzine (ATARAX) tablet 50 mg  50 mg Oral TID PRN Clapacs, Jackquline Denmark, MD      ? magnesium hydroxide (MILK OF MAGNESIA) suspension 30 mL  30 mL Oral Daily PRN Clapacs, Jackquline Denmark, MD      ? ?PTA Medications: ?Medications Prior to Admission  ?Medication Sig Dispense Refill Last Dose  ? dicyclomine (BENTYL) 20 MG tablet Take 1 tablet (20 mg total) by mouth 2 (two) times daily. (Patient not taking: Reported on 12/23/2019) 10 tablet 0   ? diphenoxylate-atropine (LOMOTIL) 2.5-0.025 MG tablet Take 2 tablets by mouth 4 (four) times daily as needed for diarrhea or loose stools. (Patient not taking: Reported on 12/23/2019) 30 tablet 0   ? ondansetron (ZOFRAN) 4 MG tablet Take 1 tablet (4 mg total) by mouth every 8 (eight) hours as needed for nausea or vomiting. (Patient not taking: Reported on 11/20/2017) 8 tablet 0   ? promethazine (PHENERGAN) 25 MG tablet Take 1 tablet (25 mg total) by mouth every 6 (six) hours as needed for nausea or vomiting. (Patient not taking: Reported on 12/23/2019) 15 tablet 0   ? ? ?Patient Stressors: Financial difficulties   ?Loss of Mother   ?Medication change or noncompliance    ? ?Patient Strengths: General fund of knowledge  ?Religious Affiliation  ?Supportive family/friends  ? ?Treatment Modalities: Medication Management, Group therapy, Case management,  ?1 to 1 session with clinician, Psychoeducation, Recreational therapy. ? ? ?Physician Treatment Plan for Primary Diagnosis: Schizophrenia (HCC) ?Long Term Goal(s): Improvement in symptoms so as ready for discharge  ? ?Short Term Goals: Compliance with prescribed medications will improve ?Ability to identify triggers associated with substance abuse/mental health issues will improve ?Ability to demonstrate self-control will improve ?Ability to identify and develop effective coping behaviors will improve ? ?Medication Management: Evaluate patient's response, side effects, and tolerance of medication regimen. ? ?Therapeutic Interventions: 1 to 1 sessions, Unit Group sessions and Medication administration. ? ?Evaluation of Outcomes: Progressing ? ?Physician Treatment Plan for Secondary Diagnosis: Principal Problem: ?  Schizophrenia (HCC) ?Active Problems: ?  Substance induced mood disorder (HCC) ? ?Long Term Goal(s): Improvement in symptoms so as ready for discharge  ? ?Short Term Goals: Compliance with prescribed medications will improve ?Ability to identify triggers associated with substance abuse/mental health issues will improve ?Ability to demonstrate self-control will improve ?Ability to identify and develop effective coping behaviors will improve    ? ?Medication Management: Evaluate patient's response, side effects, and tolerance of medication regimen. ? ?Therapeutic Interventions: 1 to 1 sessions, Unit Group sessions and Medication administration. ? ?Evaluation of Outcomes: Progressing ? ? ?RN Treatment Plan for  Primary Diagnosis: Schizophrenia (HCC) ?Long Term Goal(s): Knowledge of disease and therapeutic regimen to maintain health will improve ? ?Short Term Goals: Ability to remain free from injury will improve, Ability to  verbalize frustration and anger appropriately will improve, Ability to demonstrate self-control, Ability to participate in decision making will improve, Ability to verbalize feelings will improve, Ability to disclose and discuss suicidal ideas, Ability to identify and develop effective coping behaviors will improve, and Compliance with prescribed medications will improve ? ?Medication Management: RN will administer medications as ordered by provider, will assess and evaluate patient's response and provide education to patient for prescribed medication. RN will report any adverse and/or side effects to prescribing provider. ? ?Therapeutic Interventions: 1 on 1 counseling sessions, Psychoeducation, Medication administration, Evaluate responses to treatment, Monitor vital signs and CBGs as ordered, Perform/monitor CIWA, COWS, AIMS and Fall Risk screenings as ordered, Perform wound care treatments as ordered. ? ?Evaluation of Outcomes: Progressing ? ? ?LCSW Treatment Plan for Primary Diagnosis: Schizophrenia (HCC) ?Long Term Goal(s): Safe transition to appropriate next level of care at discharge, Engage patient in therapeutic group addressing interpersonal concerns. ? ?Short Term Goals: Engage patient in aftercare planning with referrals and resources, Increase social support, Increase ability to appropriately verbalize feelings, Increase emotional regulation, Facilitate acceptance of mental health diagnosis and concerns, Facilitate patient progression through stages of change regarding substance use diagnoses and concerns, Identify triggers associated with mental health/substance abuse issues, and Increase skills for wellness and recovery ? ?Therapeutic Interventions: Assess for all discharge needs, 1 to 1 time with Child psychotherapist, Explore available resources and support systems, Assess for adequacy in community support network, Educate family and significant other(s) on suicide prevention, Complete Psychosocial  Assessment, Interpersonal group therapy. ? ?Evaluation of Outcomes: Progressing ? ? ?Progress in Treatment: ?Attending groups: No. ?Participating in groups: No. ?Taking medication as prescribed: Yes. ?Toleration medication: Yes. ?Family/Significant other contact made: No, will contact:  CSW will obtain consent to reach family/friend.  ?Patient understands diagnosis: Yes. ?Discussing patient identified problems/goals with staff: Yes. ?Medical problems stabilized or resolved: Yes. ?Denies suicidal/homicidal ideation: Yes. ?Issues/concerns per patient self-inventory: Yes. ?Other: none  ? ?New problem(s) identified: No, Describe:  Patient states he is experiencing housing insecurity.  ? ?New Short Term/Long Term Goal(s): Patient to work towards detox, elimination of symptoms of psychosis, medication management for mood stabilization development of comprehensive mental wellness/sobriety plan. ? ?Patient Goals:  States, "Just stay whole . . . Whatever y'all say. I got a couple of job applications and they have not called. I got a place I can got to in [place]."  ? ?Discharge Plan or Barriers: Patient unsure of housing situation at discharge. CSW to have further conversations about housing.  ? ?Reason for Continuation of Hospitalization: Other; describe Disorganized thoughts/behaviors.  ? ?Estimated Length of Stay: 1-7 days  ? ?Last 3 [place] Suicide Severity Risk Score: ?Flowsheet Row Admission (Current) from 07/08/2021 in [facility] ED from 06/11/2020 in [facility] ED from 12/22/2019 in [facility]  ?C-SSRS RISK CATEGORY No Risk Low Risk No Risk  ? ?  ? ? ?Last PHQ 2/9 Scores: ?   ? View : No data to display.  ?  ?  ?  ? ? ?Scribe for Treatment Team: ?Corky Crafts, LCSWA ?07/09/2021 ?10:27 AM ? ? ?

## 2021-07-09 NOTE — Group Note (Signed)
[facility] LCSW Group Therapy Note ? ? ?Group Date: 07/09/2021 ?Start Time: 1330 ?End Time: 1430 ? ? ?Type of Therapy/Topic:  Group Therapy:  Emotion Regulation ? ?Participation Level:  Did Not Attend  ? ? ? ?Description of Group:   ? The purpose of this group is to assist patients in learning to regulate negative emotions and experience positive emotions. Patients will be guided to discuss ways in which they have been vulnerable to their negative emotions. These vulnerabilities will be juxtaposed with experiences of positive emotions or situations, and patients challenged to use positive emotions to combat negative ones. Special emphasis will be placed on coping with negative emotions in conflict situations, and patients will process healthy conflict resolution skills. ? ?Therapeutic Goals: ?Patient will identify two positive emotions or experiences to reflect on in order to balance out negative emotions:  ?Patient will label two or more emotions that they find the most difficult to experience:  ?Patient will be able to demonstrate positive conflict resolution skills through discussion or role plays:  ? ?Summary of Patient Progress: ? ? ?x ? ? ? ?Therapeutic Modalities:   ?Cognitive Behavioral Therapy ?Feelings Identification ?Dialectical Behavioral Therapy ? ? ?Larie Mathes F Doral Digangi, Student-Social Work ?

## 2021-07-09 NOTE — Progress Notes (Signed)
[facility] MD Progress Note ? ?07/09/2021 11:34 AM ?Seth Fuentes  ?MRN:  [id number] ?Subjective: Follow-up 31 year old man with schizophrenia.  Patient attended treatment team today and met with me individually.  He appears to be much better organized.  Able to hold a conversation without straining off into bizarre topics.  Neatly groomed taking care of himself adequately.  Interacts appropriately with others.  No side effects reported from medication ?Principal Problem: Schizophrenia ([place]) ?Diagnosis: Principal Problem: ?  Schizophrenia ([place]) ?Active Problems: ?  Substance induced mood disorder ([place]) ? ?Total Time spent with patient: 30 minutes ? ?Past Psychiatric History: Past history of psychotic symptoms and substance abuse ? ?Past Medical History: History reviewed. No pertinent past medical history. History reviewed. No pertinent surgical history. ?Family History: History reviewed. No pertinent family history. ?Family Psychiatric  History: See previous ?Social History:  ?Social History  ? ?Substance and Sexual Activity  ?Alcohol Use Not Currently  ?   ?Social History  ? ?Substance and Sexual Activity  ?Drug Use Not Currently  ?  ?Social History  ? ?Socioeconomic History  ? Marital status: Single  ?  Spouse name: Not on file  ? Number of children: Not on file  ? Years of education: Not on file  ? Highest education level: Not on file  ?Occupational History  ? Not on file  ?Tobacco Use  ? Smoking status: Unknown  ? Smokeless tobacco: Not on file  ?Substance and Sexual Activity  ? Alcohol use: Not Currently  ? Drug use: Not Currently  ? Sexual activity: Not on file  ?Other Topics Concern  ? Not on file  ?Social History Narrative  ? Not on file  ? ?Social Determinants of Health  ? ?Financial Resource Strain: Not on file  ?Food Insecurity: Not on file  ?Transportation Needs: Not on file  ?Physical Activity: Not on file  ?Stress: Not on file  ?Social Connections: Not on file  ? ?Additional Social History:  ?  ?  ?  ?  ?  ?  ?   ?  ?  ?  ?  ? ?Sleep: Fair ? ?Appetite:  Fair ? ?Current Medications: ?Current Facility-Administered Medications  ?Medication Dose Route Frequency Provider Last Rate Last Admin  ? acetaminophen (TYLENOL) tablet 650 mg  650 mg Oral Q6H PRN Bailee Metter T, MD      ? alum & mag hydroxide-simeth (MAALOX/MYLANTA) 200-200-20 MG/5ML suspension 30 mL  30 mL Oral Q4H PRN Chadric Kimberley T, MD      ? benztropine (COGENTIN) tablet 1 mg  1 mg Oral BID Yussuf Sawyers, Madie Reno, MD   1 mg at 07/09/21 0801  ? haloperidol (HALDOL) tablet 5 mg  5 mg Oral BID Teyton Pattillo T, MD   5 mg at 07/09/21 0801  ? hydrOXYzine (ATARAX) tablet 50 mg  50 mg Oral TID PRN Nandika Stetzer, Madie Reno, MD      ? magnesium hydroxide (MILK OF MAGNESIA) suspension 30 mL  30 mL Oral Daily PRN Krystn Dermody, Madie Reno, MD      ? ? ?Lab Results:  ?Results for orders placed or performed during the hospital encounter of 07/08/21 (from the past 48 hour(s))  ?Hemoglobin A1c     Status: None  ? Collection Time: 07/09/21  7:06 AM  ?Result Value Ref Range  ? Hgb A1c MFr Bld 5.4 4.8 - 5.6 %  ?  Comment: (NOTE) ?Pre diabetes:          5.7%-6.4% ? ?Diabetes:              >  6.4% ? ?Glycemic control for   <7.0% ?adults with diabetes ?  ? Mean Plasma Glucose 108.28 mg/dL  ?  Comment: Performed at [facility], [place] [address] [place], [place] [address]  ?Lipid panel     Status: None  ? Collection Time: 07/09/21  7:06 AM  ?Result Value Ref Range  ? Cholesterol 162 0 - 200 mg/dL  ? Triglycerides 91 <150 mg/dL  ? HDL 63 >40 mg/dL  ? Total CHOL/HDL Ratio 2.6 RATIO  ? VLDL 18 0 - 40 mg/dL  ? LDL Cholesterol 81 0 - 99 mg/dL  ?  Comment:        ?Total Cholesterol/HDL:CHD Risk ?Coronary Heart Disease Risk Table ?                    Men   Women ? 1/2 Average Risk   3.4   3.3 ? Average Risk       5.0   4.4 ? 2 X Average Risk   9.6   7.1 ? 3 X Average Risk  23.4   11.0 ?       ?Use the calculated Patient Ratio ?above and the CHD Risk Table ?to determine the patient's CHD Risk. ?       ?ATP III  CLASSIFICATION (LDL): ? <100     mg/dL   Optimal ? 100-129  mg/dL   Near or Above ?                   Optimal ? 130-159  mg/dL   Borderline ? 160-189  mg/dL   High ? >190     mg/dL   Very High ?Performed at [facility], [address] [place], [place] [address] ?  ? ? ?Blood Alcohol level:  ?Lab Results  ?Component Value Date  ? ETH <10 07/06/2021  ? ETH <10 12/22/2019  ? ? ?Metabolic Disorder Labs: ?Lab Results  ?Component Value Date  ? HGBA1C 5.4 07/09/2021  ? MPG 108.28 07/09/2021  ? ?No results found for: PROLACTIN ?Lab Results  ?Component Value Date  ? CHOL 162 07/09/2021  ? TRIG 91 07/09/2021  ? HDL 63 07/09/2021  ? CHOLHDL 2.6 07/09/2021  ? VLDL 18 07/09/2021  ? [place] 81 07/09/2021  ? ? ?Physical Findings: ?AIMS:  , ,  ,  ,    ?CIWA:    ?COWS:    ? ?Musculoskeletal: ?Strength & Muscle Tone: within normal limits ?Gait & Station: normal ?Patient leans: N/A ? ?Psychiatric Specialty Exam: ? ?Presentation  ?General Appearance: Appropriate for Environment ? ?Eye Contact:Good ? ?Speech:Clear and Coherent ? ?Speech Volume:Normal ? ?Handedness:No data recorded ? ?Mood and Affect  ?Mood:Dysphoric ? ?Affect:Blunt; Congruent ? ? ?Thought Process  ?Thought Processes:Disorganized ? ?Descriptions of Associations:Loose ? ?Orientation:Partial ? ?Thought Content:Delusions ? ?History of Schizophrenia/Schizoaffective disorder:No ? ?Duration of Psychotic Symptoms:Greater than six months ? ?Hallucinations:No data recorded ?Ideas of Reference:Delusions; Paranoia ? ?Suicidal Thoughts:No data recorded ?Homicidal Thoughts:No data recorded ? ?Sensorium  ?Memory:Immediate Fair ? ?Judgment:Impaired ? ?Insight:Poor ? ? ?Executive Functions  ?Concentration:Fair ? ?Attention Span:Fair ? ?Recall:Fair ? ?[place] ? ?Language:Fair ? ? ?Psychomotor Activity  ?Psychomotor Activity:No data recorded ? ?Assets  ?Assets:Resilience ? ? ?Sleep  ?Sleep:No data recorded ? ? ?Physical Exam: ?Physical Exam ?Vitals and nursing  note reviewed.  ?Constitutional:   ?   Appearance: Normal appearance.  ?HENT:  ?   Head: Normocephalic and atraumatic.  ?   Mouth/Throat:  ?   Pharynx: Oropharynx  is clear.  ?Eyes:  ?   Pupils: Pupils are equal, round, and reactive to light.  ?Cardiovascular:  ?   Rate and Rhythm: Normal rate and regular rhythm.  ?Pulmonary:  ?   Effort: Pulmonary effort is normal.  ?   Breath sounds: Normal breath sounds.  ?Abdominal:  ?   General: Abdomen is flat.  ?   Palpations: Abdomen is soft.  ?Musculoskeletal:     ?   General: Normal range of motion.  ?Skin: ?   General: Skin is warm and dry.  ?Neurological:  ?   General: No focal deficit present.  ?   Mental Status: He is alert. Mental status is at baseline.  ?Psychiatric:     ?   Mood and Affect: Mood normal.     ?   Thought Content: Thought content normal.  ? ?Review of Systems  ?Constitutional: Negative.   ?HENT: Negative.    ?Eyes: Negative.   ?Respiratory: Negative.    ?Cardiovascular: Negative.   ?Gastrointestinal: Negative.   ?Musculoskeletal: Negative.   ?Skin: Negative.   ?Neurological: Negative.   ?Psychiatric/Behavioral: Negative.    ?Blood pressure 104/82, pulse 69, temperature 98.1 ?F (36.7 ?C), temperature source Oral, resp. rate 18, height 6' (1.829 m), weight 61.2 kg, SpO2 100 %. Body mass index is 18.31 kg/m?. ? ? ?Treatment Plan Summary: ?Medication management and Plan patient appears to be much improved and stabilizing.  No acute dangerous behavior.  Encouraged him to attend groups and interact with others.  We will probably be able to start thinking about possible discharge as early as tomorrow.  Patient says he does have a safe place to go but is not clear whether this is correct or wishful thinking. ? ?Alethia Berthold, MD ?07/09/2021, 11:34 AM ? ?

## 2021-07-09 NOTE — Plan of Care (Signed)
Patient states that he slept most of the shift and feels better. Patient stated that he is going to make phone calls and trying to find a place to live. Patient appropriate with staff & peers.Denies SI,HI and AVH.Appetite and energy level good. ADLs maintained. Attended groups. Support and encouragement given. ?

## 2021-07-09 NOTE — [facility] Suicide Risk Assessment (Signed)
[facility] INPATIENT:  Family/Significant Other Suicide Prevention Education ? ?Suicide Prevention Education:  ?Patient Refusal for Family/Significant Other Suicide Prevention Education: The patient Seth Fuentes has refused to provide written consent for family/significant other to be provided Family/Significant Other Suicide Prevention Education during admission and/or prior to discharge.  Physician notified. ? ?SPE completed with pt, as pt refused to consent to family contact. SPI pamphlet provided to pt and pt was encouraged to share information with support network, ask questions, and talk about any concerns relating to SPE. Pt denies access to guns/firearms and verbalized understanding of information provided. Mobile Crisis information also provided to pt.  ? ?Harden Mo ?07/09/2021, 3:23 PM ?

## 2021-07-09 NOTE — Progress Notes (Signed)
Recreation Therapy Notes ? ? ?Date: 07/09/2021 ?  ?Time: 10:50 am ?  ?Location: Craft room    ?  ?Behavioral response: N/A ? ?Intervention Topic: Self-esteem   ?  ?Discussion/Intervention:  ?Patient refused to attend group. ? ?Clinical Observations/Feedback: ?Patient refused to attend group. ? ?[name] LRT/CTRS  ? ? ? ? ? ? ? ?Seth Fuentes ?07/09/2021 3:14 PM ?

## 2021-07-10 DIAGNOSIS — F203 Undifferentiated schizophrenia: Secondary | ICD-10-CM | POA: Diagnosis not present

## 2021-07-10 NOTE — Group Note (Signed)
[facility] LCSW Group Therapy Note ? ? ?Group Date: 07/10/2021 ?Start Time: 1300 ?End Time: 1400 ? ? ?Type of Therapy/Topic:  Group Therapy:  Balance in Life ? ?Participation Level:  Minimal  ? ?Description of Group:   ? This group will address the concept of balance and how it feels and looks when one is unbalanced. Patients will be encouraged to process areas in their lives that are out of balance, and identify reasons for remaining unbalanced. Facilitators will guide patients utilizing problem- solving interventions to address and correct the stressor making their life unbalanced. Understanding and applying boundaries will be explored and addressed for obtaining  and maintaining a balanced life. Patients will be encouraged to explore ways to assertively make their unbalanced needs known to significant others in their lives, using other group members and facilitator for support and feedback. ? ?Therapeutic Goals: ?Patient will identify two or more emotions or situations they have that consume much of in their lives. ?Patient will identify signs/triggers that life has become out of balance:  ?Patient will identify two ways to set boundaries in order to achieve balance in their lives:  ?Patient will demonstrate ability to communicate their needs through discussion and/or role plays ? ?Summary of Patient Progress: ? ? ? ?Patient was present for the entirety of group session. Patient participated in opening and closing remarks. However, patient did not contribute at all to the topic of discussion despite encouraged participation. Patient's only contribution to group was largely tangential w/ loose associations.  ? ? ? ?Therapeutic Modalities:   ?Cognitive Behavioral Therapy ?Solution-Focused Therapy ?Assertiveness Training ? ? ?Corky Crafts, LCSWA ?

## 2021-07-10 NOTE — Progress Notes (Signed)
[facility] MD Progress Note ? ?07/10/2021 11:01 AM ?Seth Fuentes  ?MRN:  [id number] ?Subjective: Patient seen and chart reviewed.  Patient is neatly dressed and groomed very pleasant in his interaction.  Much more organized in his thinking than he was earlier in his hospital stay.  He tells me today that while the idea of going to [facility] is still a possibility, he would much rather stay locally because he has a court date going up.  He inquires if we can [facility] into the [facility] for [facility]. ?Principal Problem: Schizophrenia ([place]) ?Diagnosis: Principal Problem: ?  Schizophrenia ([place]) ?Active Problems: ?  Substance induced mood disorder ([place]) ? ?Total Time spent with patient: 30 minutes ? ?Past Psychiatric History: Past history of schizophrenia and substance abuse ? ?Past Medical History: History reviewed. No pertinent past medical history. History reviewed. No pertinent surgical history. ?Family History: History reviewed. No pertinent family history. ?Family Psychiatric  History: See previous ?Social History:  ?Social History  ? ?Substance and Sexual Activity  ?Alcohol Use Not Currently  ?   ?Social History  ? ?Substance and Sexual Activity  ?Drug Use Not Currently  ?  ?Social History  ? ?Socioeconomic History  ? Marital status: Single  ?  Spouse name: Not on file  ? Number of children: Not on file  ? Years of education: Not on file  ? Highest education level: Not on file  ?Occupational History  ? Not on file  ?Tobacco Use  ? Smoking status: Unknown  ? Smokeless tobacco: Not on file  ?Substance and Sexual Activity  ? Alcohol use: Not Currently  ? Drug use: Not Currently  ? Sexual activity: Not on file  ?Other Topics Concern  ? Not on file  ?Social History Narrative  ? Not on file  ? ?Social Determinants of Health  ? ?Financial Resource Strain: Not on file  ?Food Insecurity: Not on file  ?Transportation Needs: Not on file  ?Physical Activity: Not on file  ?Stress: Not on file  ?Social Connections: Not on file   ? ?Additional Social History:  ?  ?  ?  ?  ?  ?  ?  ?  ?  ?  ?  ? ?Sleep: Fair ? ?Appetite:  Fair ? ?Current Medications: ?Current Facility-Administered Medications  ?Medication Dose Route Frequency Provider Last Rate Last Admin  ? acetaminophen (TYLENOL) tablet 650 mg  650 mg Oral Q6H PRN Breeanna Galgano T, MD      ? alum & mag hydroxide-simeth (MAALOX/MYLANTA) 200-200-20 MG/5ML suspension 30 mL  30 mL Oral Q4H PRN Amorette Charrette T, MD      ? benztropine (COGENTIN) tablet 1 mg  1 mg Oral BID Keiaira Donlan, Madie Reno, MD   1 mg at 07/10/21 [id number]  ? haloperidol (HALDOL) tablet 5 mg  5 mg Oral BID Desa Rech T, MD   5 mg at 07/10/21 [id number]  ? hydrOXYzine (ATARAX) tablet 50 mg  50 mg Oral TID PRN Indalecio Malmstrom, Madie Reno, MD      ? magnesium hydroxide (MILK OF MAGNESIA) suspension 30 mL  30 mL Oral Daily PRN Tamla Winkels, Madie Reno, MD      ? ? ?Lab Results:  ?Results for orders placed or performed during the hospital encounter of 07/08/21 (from the past 48 hour(s))  ?Hemoglobin A1c     Status: None  ? Collection Time: 07/09/21  7:06 AM  ?Result Value Ref Range  ? Hgb A1c MFr Bld 5.4 4.8 - 5.6 %  ?  Comment: (  NOTE) ?Pre diabetes:          5.7%-6.4% ? ?Diabetes:              >6.4% ? ?Glycemic control for   <7.0% ?adults with diabetes ?  ? Mean Plasma Glucose 108.28 mg/dL  ?  Comment: Performed at [facility], [place] [address], [place], [place] [address]  ?Lipid panel     Status: None  ? Collection Time: 07/09/21  7:06 AM  ?Result Value Ref Range  ? Cholesterol 162 0 - 200 mg/dL  ? Triglycerides 91 <150 mg/dL  ? HDL 63 >40 mg/dL  ? Total CHOL/HDL Ratio 2.6 RATIO  ? VLDL 18 0 - 40 mg/dL  ? LDL Cholesterol 81 0 - 99 mg/dL  ?  Comment:        ?Total Cholesterol/HDL:CHD Risk ?Coronary Heart Disease Risk Table ?                    Men   Women ? 1/2 Average Risk   3.4   3.3 ? Average Risk       5.0   4.4 ? 2 X Average Risk   9.6   7.1 ? 3 X Average Risk  23.4   11.0 ?       ?Use the calculated Patient Ratio ?above and the CHD Risk Table ?to  determine the patient's CHD Risk. ?       ?ATP III CLASSIFICATION (LDL): ? <100     mg/dL   Optimal ? 100-129  mg/dL   Near or Above ?                   Optimal ? 130-159  mg/dL   Borderline ? 160-189  mg/dL   High ? >190     mg/dL   Very High ?Performed at [facility], [address] [place], [place] [address] ?  ? ? ?Blood Alcohol level:  ?Lab Results  ?Component Value Date  ? ETH <10 07/06/2021  ? ETH <10 12/22/2019  ? ? ?Metabolic Disorder Labs: ?Lab Results  ?Component Value Date  ? HGBA1C 5.4 07/09/2021  ? MPG 108.28 07/09/2021  ? ?No results found for: PROLACTIN ?Lab Results  ?Component Value Date  ? CHOL 162 07/09/2021  ? TRIG 91 07/09/2021  ? HDL 63 07/09/2021  ? CHOLHDL 2.6 07/09/2021  ? VLDL 18 07/09/2021  ? [place] 81 07/09/2021  ? ? ?Physical Findings: ?AIMS:  , ,  ,  ,    ?CIWA:    ?COWS:    ? ?Musculoskeletal: ?Strength & Muscle Tone: within normal limits ?Gait & Station: normal ?Patient leans: N/A ? ?Psychiatric Specialty Exam: ? ?Presentation  ?General Appearance: Appropriate for Environment ? ?Eye Contact:Good ? ?Speech:Clear and Coherent ? ?Speech Volume:Normal ? ?Handedness:No data recorded ? ?Mood and Affect  ?Mood:Dysphoric ? ?Affect:Blunt; Congruent ? ? ?Thought Process  ?Thought Processes:Disorganized ? ?Descriptions of Associations:Loose ? ?Orientation:Partial ? ?Thought Content:Delusions ? ?History of Schizophrenia/Schizoaffective disorder:No ? ?Duration of Psychotic Symptoms:Greater than six months ? ?Hallucinations:No data recorded ?Ideas of Reference:Delusions; Paranoia ? ?Suicidal Thoughts:No data recorded ?Homicidal Thoughts:No data recorded ? ?Sensorium  ?Memory:Immediate Fair ? ?Judgment:Impaired ? ?Insight:Poor ? ? ?Executive Functions  ?Concentration:Fair ? ?Attention Span:Fair ? ?Recall:Fair ? ?[place] ? ?Language:Fair ? ? ?Psychomotor Activity  ?Psychomotor Activity:No data recorded ? ?Assets  ?Assets:Resilience ? ? ?Sleep  ?Sleep:No data  recorded ? ? ?Physical Exam: ?Physical Exam ?Vitals and nursing note reviewed.  ?Constitutional:   ?  Appearance: Normal appearance.  ?HENT:  ?   Head: Normocephalic and atraumatic.  ?   Mouth/Throat:  ?   Pharynx: Oropharynx is clear.  ?Eyes:  ?   Pupils: Pupils are equal, round, and reactive to light.  ?Cardiovascular:  ?   Rate and Rhythm: Normal rate and regular rhythm.  ?Pulmonary:  ?   Effort: Pulmonary effort is normal.  ?   Breath sounds: Normal breath sounds.  ?Abdominal:  ?   General: Abdomen is flat.  ?   Palpations: Abdomen is soft.  ?Musculoskeletal:     ?   General: Normal range of motion.  ?Skin: ?   General: Skin is warm and dry.  ?Neurological:  ?   General: No focal deficit present.  ?   Mental Status: He is alert. Mental status is at baseline.  ?Psychiatric:     ?   Mood and Affect: Mood normal.     ?   Thought Content: Thought content normal.  ? ?Review of Systems  ?Constitutional: Negative.   ?HENT: Negative.    ?Eyes: Negative.   ?Respiratory: Negative.    ?Cardiovascular: Negative.   ?Gastrointestinal: Negative.   ?Musculoskeletal: Negative.   ?Skin: Negative.   ?Neurological: Negative.   ?Psychiatric/Behavioral: Negative.    ?Blood pressure 105/72, pulse 75, temperature 98.3 ?F (36.8 ?C), temperature source Oral, resp. rate 18, height 6' (1.829 m), weight 61.2 kg, SpO2 100 %. Body mass index is 18.31 kg/m?. ? ? ?Treatment Plan Summary: ?Medication management and Plan no change to medication.  Patient is tolerating it well and is agreeable to continuing it.  Treatment team discussed his discharge plan and we will look into whether there are shelter beds available.  Meantime continue medicine continue group and activity involvement. ? ?Alethia Berthold, MD ?07/10/2021, 11:01 AM ? ?

## 2021-07-10 NOTE — Progress Notes (Signed)
Patient calm and cooperative during assessment denying SI/HI/AVH. Pt observed interacting appropriately with staff and peers on the unit. Pt didn't have any medications scheduled tonight and hasn't requested anything PRN. Pt given education, support, and encouragement to be active in his treatment plan. Pt being monitored Q 15 minutes for safety per unit protocol. Pt remains safe on the unit.  ?

## 2021-07-10 NOTE — Progress Notes (Signed)
D: Patient alert and oriented. Patient denies pain. Patient denies anxiety and depression. Patient denies SI/HI/AVH. Patient did go outside to the courtyard throughout the day when patients were given the opportunity. Patient interacts appropriately with staff and peers. ? ?A: Scheduled medications administered to patient, per MD orders.  Support and encouragement provided to patient.  ?Q15 minute safety checks maintained.  ? ?R: Patient compliant with medication administration and treatment plan. No adverse drug reactions noted. Patient remains safe on the unit at this time.  ?

## 2021-07-10 NOTE — Progress Notes (Signed)
Recreation Therapy Notes ? ?Date: 07/10/2021 ? ?Time: 10:30 am    ? ?Location: Courtyard   ? ?Behavioral response: Appropriate ? ?Intervention Topic: Wellness    ? ?Discussion/Intervention:  ?Group content today was focused on Wellness. The group defined wellness and some positive ways they make decisions for themselves. Individuals expressed reasons why they neglected any wellness in the past. Patients described ways to improve wellness skills in the future. The group explained what could happen if they did not do any wellness at all. Participants express how bad choices has affected them and others around them. Individual explained the importance of wellness. The group participated in the intervention ?Testing my Wellness? where they had a chance to identify some of their weaknesses and strengths in wellness.  ?Clinical Observations/Feedback: ?Patient came to group late due to unknow reasons.Individual was social with peers and staff while participating in the intervention.    ?Seth Fuentes LRT/CTRS  ? ? ? ? ? ? ? ?Seth Fuentes ?07/10/2021 1:00 PM ?

## 2021-07-10 NOTE — Plan of Care (Signed)
?  Problem: Education: ?Goal: Knowledge of [place] [facility] Education information/materials will improve ?Outcome: Progressing ?Goal: Verbalization of understanding the information provided will improve ?Outcome: Progressing ?  ?Problem: Safety: ?Goal: Periods of time without injury will increase ?Outcome: Progressing ?  ?Problem: Education: ?Goal: Ability to make informed decisions regarding treatment will improve ?Outcome: Progressing ?  ?Problem: Education: ?Goal: Knowledge of the prescribed therapeutic regimen will improve ?Outcome: Progressing ?  ?Problem: Role Relationship: ?Goal: Ability to interact with others will improve ?Outcome: Progressing ?  ?

## 2021-07-11 ENCOUNTER — Encounter: Payer: Self-pay | Admitting: Psychiatry

## 2021-07-11 DIAGNOSIS — F203 Undifferentiated schizophrenia: Secondary | ICD-10-CM | POA: Diagnosis not present

## 2021-07-11 NOTE — Progress Notes (Signed)
[facility] MD Progress Note ? ?07/11/2021 11:14 AM ?Seth Fuentes  ?MRN:  [id number] ?Subjective: Follow-up 31 year old man with schizophrenia and substance abuse disorder.  Patient has no new complaints.  Denies current hallucinations.  Seth Fuentes has been calm and appropriate.  Good self-care.  Patient was advised that there is projected to be [facility]. ?Principal Problem: Schizophrenia ([facility]) ?Diagnosis: Principal Problem: ?  Schizophrenia ([facility]) ?Active Problems: ?  Substance induced mood disorder ([facility]) ? ?Total Time spent with patient: 30 minutes ? ?Past Psychiatric History: Past history of psychotic symptoms often in the context of substance abuse ? ?Past Medical History: History reviewed. No pertinent past medical history. History reviewed. No pertinent surgical history. ?Family History: History reviewed. No pertinent family history. ?Family Psychiatric  History: See previous ?Social History:  ?Social History  ? ?Substance and Sexual Activity  ?Alcohol Use Not Currently  ?   ?Social History  ? ?Substance and Sexual Activity  ?Drug Use Not Currently  ?  ?Social History  ? ?Socioeconomic History  ? Marital status: Single  ?  Spouse name: Not on file  ? Number of children: Not on file  ? Years of education: Not on file  ? Highest education level: Not on file  ?Occupational History  ? Not on file  ?Tobacco Use  ? Smoking status: Unknown  ? Smokeless tobacco: Not on file  ?Substance and Sexual Activity  ? Alcohol use: Not Currently  ? Drug use: Not Currently  ? Sexual activity: Not on file  ?Other Topics Concern  ? Not on file  ?Social History Narrative  ? Not on file  ? ?Social Determinants of Health  ? ?Financial Resource Strain: Not on file  ?Food Insecurity: Not on file  ?Transportation Needs: Not on file  ?Physical Activity: Not on file  ?Stress: Not on file  ?Social Connections: Not on file  ? ?Additional Social History:  ?  ?  ?  ?  ?  ?  ?  ?  ?  ?  ?   ? ?Sleep: Fair ? ?Appetite:  Fair ? ?Current Medications: ?Current Facility-Administered Medications  ?Medication Dose Route Frequency Provider Last Rate Last Admin  ? acetaminophen (TYLENOL) tablet 650 mg  650 mg Oral Q6H PRN Keasia Dubose T, MD      ? alum & mag hydroxide-simeth (MAALOX/MYLANTA) 200-200-20 MG/5ML suspension 30 mL  30 mL Oral Q4H PRN Alondria Mousseau T, MD      ? benztropine (COGENTIN) tablet 1 mg  1 mg Oral BID Reon Hunley, Jackquline Denmark, MD   1 mg at 07/11/21 1601  ? haloperidol (HALDOL) tablet 5 mg  5 mg Oral BID Genelda Roark T, MD   5 mg at 07/11/21 0932  ? hydrOXYzine (ATARAX) tablet 50 mg  50 mg Oral TID PRN Talis Iwan, Jackquline Denmark, MD      ? magnesium hydroxide (MILK OF MAGNESIA) suspension 30 mL  30 mL Oral Daily PRN Analea Muller T, MD      ? ? ?Lab Results: No results found for this or any previous visit (from the past 48 hour(s)). ? ?Blood Alcohol level:  ?Lab Results  ?Component Value Date  ? ETH <10 07/06/2021  ? ETH <10 12/22/2019  ? ? ?Metabolic Disorder Labs: ?Lab Results  ?Component Value Date  ? HGBA1C 5.4 07/09/2021  ? MPG 108.28 07/09/2021  ? ?No results found for: PROLACTIN ?Lab Results  ?Component Value Date  ? CHOL 162 07/09/2021  ?  TRIG 91 07/09/2021  ? HDL 63 07/09/2021  ? CHOLHDL 2.6 07/09/2021  ? VLDL 18 07/09/2021  ? LDLCALC 81 07/09/2021  ? ? ?Physical Findings: ?AIMS:  , ,  ,  ,    ?CIWA:    ?COWS:    ? ?Musculoskeletal: ?Strength & Muscle Tone: within normal limits ?Gait & Station: normal ?Patient leans: N/A ? ?Psychiatric Specialty Exam: ? ?Presentation  ?General Appearance: Appropriate for Environment ? ?Eye Contact:Good ? ?Speech:Clear and Coherent ? ?Speech Volume:Normal ? ?Handedness:No data recorded ? ?Mood and Affect  ?Mood:Dysphoric ? ?Affect:Blunt; Congruent ? ? ?Thought Process  ?Thought Processes:Disorganized ? ?Descriptions of Associations:Loose ? ?Orientation:Partial ? ?Thought Content:Delusions ? ?History of Schizophrenia/Schizoaffective disorder:No ? ?Duration of Psychotic  Symptoms:Greater than six months ? ?Hallucinations:No data recorded ?Ideas of Reference:Delusions; Paranoia ? ?Suicidal Thoughts:No data recorded ?Homicidal Thoughts:No data recorded ? ?Sensorium  ?Memory:Immediate Fair ? ?Judgment:Impaired ? ?Insight:Poor ? ? ?Executive Functions  ?Concentration:Fair ? ?Attention Span:Fair ? ?Recall:Fair ? ?Fund of Knowledge:Fair ? ?Language:Fair ? ? ?Psychomotor Activity  ?Psychomotor Activity:No data recorded ? ?Assets  ?Assets:Resilience ? ? ?Sleep  ?Sleep:No data recorded ? ? ?Physical Exam: ?Physical Exam ?Vitals and nursing note reviewed.  ?Constitutional:   ?   Appearance: Normal appearance.  ?HENT:  ?   Head: Normocephalic and atraumatic.  ?   Mouth/Throat:  ?   Pharynx: Oropharynx is clear.  ?Eyes:  ?   Pupils: Pupils are equal, round, and reactive to light.  ?Cardiovascular:  ?   Rate and Rhythm: Normal rate and regular rhythm.  ?Pulmonary:  ?   Effort: Pulmonary effort is normal.  ?   Breath sounds: Normal breath sounds.  ?Abdominal:  ?   General: Abdomen is flat.  ?   Palpations: Abdomen is soft.  ?Musculoskeletal:     ?   General: Normal range of motion.  ?Skin: ?   General: Skin is warm and dry.  ?Neurological:  ?   General: No focal deficit present.  ?   Mental Status: He is alert. Mental status is at baseline.  ?Psychiatric:     ?   Attention and Perception: Attention normal.     ?   Mood and Affect: Mood normal.     ?   Speech: Speech normal.     ?   Behavior: Behavior normal.     ?   Thought Content: Thought content normal.     ?   Cognition and Memory: Cognition normal.     ?   Judgment: Judgment normal.  ? ?Review of Systems  ?Constitutional: Negative.   ?HENT: Negative.    ?Eyes: Negative.   ?Respiratory: Negative.    ?Cardiovascular: Negative.   ?Gastrointestinal: Negative.   ?Musculoskeletal: Negative.   ?Skin: Negative.   ?Neurological: Negative.   ?Psychiatric/Behavioral: Negative.    ?Blood pressure 105/67, pulse (!) 57, temperature 97.8 ?F (36.6 ?C),  temperature source Oral, resp. rate 16, height 6' (1.829 m), weight 61.2 kg, SpO2 100 %. Body mass index is 18.31 kg/m?. ? ? ?Treatment Plan Summary: ?Medication management and Plan no change to medication.  Informed patient that we would recommend his staying through the weekend to go to the shelter and he agrees with that.  No other complaints from him.  No change to treatment plan.  Encourage group attendance and participation. ? ?Mordecai Rasmussen, MD ?07/11/2021, 11:14 AM ? ?

## 2021-07-11 NOTE — Progress Notes (Signed)
Recreation Therapy Notes ? ?INPATIENT RECREATION THERAPY ASSESSMENT ? ?Patient Details ?Name: Seth Fuentes ?MRN: [id number] ?DOB: 25-Nov-1990 ?Today's Date: 07/11/2021 ?      ?Information Obtained From: ?Patient ? ?Able to Participate in Assessment/Interview: ?Yes ? ?Patient Presentation: ?Responsive ? ?Reason for Admission (Per Patient): ?Active Symptoms ? ?Patient Stressors: ?  ? ?Coping Skills:   ?Isolation, Music, TV ? ?Leisure Interests (2+):  ?Music - Listen, Music - Write music, Sports - Basketball ? ?Frequency of Recreation/Participation: ?Monthly ? ?Awareness of Community Resources:  ?No ? ?Community Resources:  ?  ? ?Current Use: ?  ? ?If no, Barriers?: ?  ? ?Expressed Interest in [employer] Information: ?No ? ?[place] of Residence:  ?[place] ? ?Patient Main Form of Transportation: ?Therapist, music ? ?Patient Strengths:  ?Focusing ? ?Patient Identified Areas of Improvement:  ?N/A ? ?Patient Goal for Hospitalization:  ?Getting out ? ?Current SI (including self-harm):  ?No ? ?Current HI:  ?No ? ?Current AVH: ?No ? ?Staff Intervention Plan: ?Group Attendance, Collaborate with Interdisciplinary Treatment Team ? ?Consent to Intern Participation: ?N/A ? ?Seth Fuentes ?07/11/2021, 2:45 PM ?

## 2021-07-11 NOTE — Plan of Care (Signed)
D: Pt alert and oriented. Pt rates depression 0/10, hopelessness 0/10, and anxiety 0/10. Pt goal: "My discharge plan." Pt reports energy level as low-normal and concentration as being good. Pt reports sleep last night as being good. Pt did not receive medications for sleep. Pt denies experiencing any pain at this time. Pt denies experiencing any SI/HI, or AVH at this time.  ? ?A: Scheduled medications administered to pt, per MD orders. Support and encouragement provided. Frequent verbal contact made. Routine safety checks conducted q15 minutes.  ? ?R: No adverse drug reactions noted. Pt verbally contracts for safety at this time. Pt compliant with medications. Pt interacts well with others on the unit. Pt remains safe at this time. Will continue to monitor.  ? ?Problem: Education: ?Goal: Knowledge of [place] [facility] Education information/materials will improve ?Outcome: Progressing ?Goal: Emotional status will improve ?Outcome: Progressing ?  ?

## 2021-07-11 NOTE — Progress Notes (Signed)
Recreation Therapy Notes ? ? ?Date: 07/11/2021  ?  ?Time: 10:45am  ?  ?Location: Courtyard  ?  ?Behavioral response: Appropriate ?  ?Intervention Topic:  Social Skills  ?  ?Discussion/Intervention:  ?Group content on today was focused on social skills. The group defined social skills and identified ways they use social skills. Patients expressed what obstacles they face when trying to be social. Participants described the importance of social skills. The group listed ways to improve social skills and reasons to improve social skills. Individuals had an opportunity to learn new and improve social skills as well as identify their weaknesses. ?  ?Clinical Observations/Feedback: ?Patient came to group and expressed past experiences with socializing. Individual was social with peers and staff while participating in the intervention.   ?Destane Speas LRT/CTRS  ?  ? ? ? ? ? ? ? ?Anjuli Gemmill ?07/11/2021 12:22 PM ?

## 2021-07-11 NOTE — Group Note (Signed)
[facility] LCSW Group Therapy Note ? ? ?Group Date: 07/11/2021 ?Start Time: 1300 ?End Time: 1400 ? ?Type of Therapy and Topic:  Group Therapy:  Feelings around Relapse and Recovery ? ?Participation Level:  Did Not Attend  ? ?Mood: ? ?Description of Group:   ? Patients in this group will discuss emotions they experience before and after a relapse. They will process how experiencing these feelings, or avoidance of experiencing them, relates to having a relapse. Facilitator will guide patients to explore emotions they have related to recovery. Patients will be encouraged to process which emotions are more powerful. They will be guided to discuss the emotional reaction significant others in their lives may have to patients? relapse or recovery. Patients will be assisted in exploring ways to respond to the emotions of others without this contributing to a relapse. ? ?Therapeutic Goals: ?Patient will identify two or more emotions that lead to relapse for them:  ?Patient will identify two emotions that result when they relapse:  ?Patient will identify two emotions related to recovery:  ?Patient will demonstrate ability to communicate their needs through discussion and/or role plays. ? ? ?Summary of Patient Progress: ? ?Patient did not attend group despite encouraged participation.  ? ? ?Therapeutic Modalities:   ?Cognitive Behavioral Therapy ?Solution-Focused Therapy ?Assertiveness Training ?Relapse Prevention Therapy ? ? ?Rhianna Raulerson W Te[place] Gracie, LCSWA ?

## 2021-07-11 NOTE — Progress Notes (Signed)
Recreation Therapy Notes ? ?INPATIENT RECREATION TR PLAN ? ?Patient Details ?Name: Seth Fuentes ?MRN: [id number] ?DOB: 10/19/1990 ?Today's Date: 07/11/2021 ? ?Rec Therapy Plan ?Is patient appropriate for Therapeutic Recreation?: Yes ?Treatment times per week: at least 3 ?Estimated Length of Stay: 5-7 days ?TR Treatment/Interventions: Group participation (Comment) ? ?Discharge Criteria ?Pt will be discharged from therapy if:: Discharged ?Treatment plan/goals/alternatives discussed and agreed upon by:: Patient/family ? ?Discharge Summary ?  ? ? ?[name] ?07/11/2021, 2:46 PM ?

## 2021-07-11 NOTE — [facility] Counselor (Addendum)
ADDENDUM ?[facility] to review and follow up with CSW on patient coming on 07/14/2021. ? ?Assunta Curtis, MSW, LCSW ?07/11/2021 4:22 PM  ? ?CSW spoke with the patient and obtained verbal permission to speak with [employer].  ? ?CSW spoke with [employer] and informed that patient does NOT have ID. He reports that that is okay as long as staff has informed the patient that when he arrives staff will take him to get an ID.  CSW spoke with the patient and informed him of this. ? ?Patient was okay with this discharge plan. ? ?Assunta Curtis, MSW, LCSW ?07/11/2021 1:28 PM  ?

## 2021-07-12 DIAGNOSIS — F203 Undifferentiated schizophrenia: Secondary | ICD-10-CM | POA: Diagnosis not present

## 2021-07-12 NOTE — Progress Notes (Signed)
D: Patient alert and oriented. Patient denies pain. Patient denies anxiety and depression. Patient denies SI/HI/AVH. Patient interactive with staff upon approach. ? ?A: Scheduled medications administered to patient, per MD orders.  Support and encouragement provided to patient.  ?Q15 minute safety checks maintained.  ? ?R: Patient compliant with medication administration and treatment plan. No adverse drug reactions noted. Patient remains safe on the unit at this time.  ?

## 2021-07-12 NOTE — Group Note (Signed)
LCSW Group Therapy Note ? ?Group Date: 07/12/2021 ?Start Time: 1315 ?End Time: [id number] ? ? ?Type of Therapy and Topic:  Group Therapy - Healthy vs Unhealthy Coping Skills ? ?Participation Level:  Active  ? ?Description of Group ?The focus of this group was to determine what unhealthy coping techniques typically are used by group members and what healthy coping techniques would be helpful in coping with various problems. Patients were guided in becoming aware of the differences between healthy and unhealthy coping techniques. Patients were asked to identify 2-3 healthy coping skills they would like to learn to use more effectively. ? ?Therapeutic Goals ?Patients learned that coping is what human beings do all day long to deal with various situations in their lives ?Patients defined and discussed healthy vs unhealthy coping techniques ?Patients identified their preferred coping techniques and identified whether these were healthy or unhealthy ?Patients determined 2-3 healthy coping skills they would like to become more familiar with and use more often. ?Patients provided support and ideas to each other ? ? ?Summary of Patient Progress:  Patient presented on time for group and participated in topic of discussion. Patient became emotional when talking about the passing of his mother 1 year ago and walked out of group. Patient returned in a calmer state and was able to resume participation in topic. Patient shared that he is having conflict with family members over his mother's inheritance. Patient was tangential but was able to be redirected. Patient expressed interest in seeing a therapist for additional support once he discharges.  ? ?Therapeutic Modalities ?Cognitive Behavioral Therapy ?Motivational Interviewing ? ?Kenna Gilbert Zwolle, LCSWA ?07/12/2021  2:46 PM   ?

## 2021-07-12 NOTE — Plan of Care (Signed)
?  Problem: Education: ?Goal: Knowledge of [facility] Education information/materials will improve ?Outcome: Progressing ?Goal: Emotional status will improve ?Outcome: Progressing ?Goal: Mental status will improve ?Outcome: Progressing ?Goal: Verbalization of understanding the information provided will improve ?Outcome: Progressing ?  ?Problem: Safety: ?Goal: Periods of time without injury will increase ?Outcome: Progressing ?  ?Problem: Education: ?Goal: Ability to make informed decisions regarding treatment will improve ?Outcome: Progressing ?  ?Problem: Self-Concept: ?Goal: Ability to disclose and discuss suicidal ideas will improve ?Outcome: Progressing ?  ?Problem: Education: ?Goal: Knowledge of the prescribed therapeutic regimen will improve ?Outcome: Progressing ?  ?

## 2021-07-12 NOTE — Progress Notes (Addendum)
[facility] MD Progress Note ? ?07/12/2021 9:20 AM ?Seth Fuentes  ?MRN:  [id number] ?Subjective:  ?Patient was initially seen for psychiatric evaluation on July 08, 2021.  He was found to be acutely psychotic and aggressive.  Per report, patient has improved since his hospitalization. ? ?Today, patient expresses himself well.  He is calm appropriate and respectful.  Indicates "I feel a lot better" since his admission to the hospital.  Denies depression or anxiety.  Notes the resolution of hallucinations.  No paranoia.  No new medical issues. ? ?He questions whether he can get disability for his schizophrenia. ? ?No issues. ? ?Principal Problem: Schizophrenia ([place]) ?Diagnosis: Principal Problem: ?  Schizophrenia ([place]) ?Active Problems: ?  Substance induced mood disorder ([place]) ? ?Total Time spent with patient: 26 minutes ? ?Past Psychiatric History: Past history of psychotic symptoms often in the context of substance abuse ? ?Past Medical History: History reviewed. No pertinent past medical history. History reviewed. No pertinent surgical history. ?Family History: History reviewed. No pertinent family history. ?Family Psychiatric  History: See previous ?Social History:  ?Social History  ? ?Substance and Sexual Activity  ?Alcohol Use Not Currently  ?   ?Social History  ? ?Substance and Sexual Activity  ?Drug Use Not Currently  ?  ?Social History  ? ?Socioeconomic History  ? Marital status: Single  ?  Spouse name: Not on file  ? Number of children: Not on file  ? Years of education: Not on file  ? Highest education level: Not on file  ?Occupational History  ? Not on file  ?Tobacco Use  ? Smoking status: Unknown  ? Smokeless tobacco: Not on file  ?Substance and Sexual Activity  ? Alcohol use: Not Currently  ? Drug use: Not Currently  ? Sexual activity: Not on file  ?Other Topics Concern  ? Not on file  ?Social History Narrative  ? Not on file  ? ?Social Determinants of Health  ? ?Financial Resource Strain: Not on file  ?Food  Insecurity: Not on file  ?Transportation Needs: Not on file  ?Physical Activity: Not on file  ?Stress: Not on file  ?Social Connections: Not on file  ? ?Additional Social History:  ?  ?  ?  ?  ?  ?  ?  ?  ?  ?  ?  ? ?Sleep: Fair ? ?Appetite:  Fair ? ?Current Medications: ?Current Facility-Administered Medications  ?Medication Dose Route Frequency Provider Last Rate Last Admin  ? acetaminophen (TYLENOL) tablet 650 mg  650 mg Oral Q6H PRN Clapacs, John T, MD      ? alum & mag hydroxide-simeth (MAALOX/MYLANTA) 200-200-20 MG/5ML suspension 30 mL  30 mL Oral Q4H PRN Clapacs, John T, MD      ? benztropine (COGENTIN) tablet 1 mg  1 mg Oral BID Clapacs, John T, MD   1 mg at 07/12/21 [id number]  ? haloperidol (HALDOL) tablet 5 mg  5 mg Oral BID Clapacs, John T, MD   5 mg at 07/12/21 [id number]  ? hydrOXYzine (ATARAX) tablet 50 mg  50 mg Oral TID PRN Clapacs, Madie Reno, MD      ? magnesium hydroxide (MILK OF MAGNESIA) suspension 30 mL  30 mL Oral Daily PRN Clapacs, John T, MD      ? ? ?Lab Results: No results found for this or any previous visit (from the past 48 hour(s)). ? ?Blood Alcohol level:  ?Lab Results  ?Component Value Date  ? ETH <10 07/06/2021  ? ETH <10 12/22/2019  ? ? ?  Metabolic Disorder Labs: ?Lab Results  ?Component Value Date  ? HGBA1C 5.4 07/09/2021  ? MPG 108.28 07/09/2021  ? ?No results found for: PROLACTIN ?Lab Results  ?Component Value Date  ? CHOL 162 07/09/2021  ? TRIG 91 07/09/2021  ? HDL 63 07/09/2021  ? CHOLHDL 2.6 07/09/2021  ? VLDL 18 07/09/2021  ? [place] 81 07/09/2021  ? ? ?Physical Findings: ?AIMS:  , ,  ,  ,    ?CIWA:    ?COWS:    ? ?Musculoskeletal: ?Strength & Muscle Tone: within normal limits ?Gait & Station: normal ?Patient leans: N/A ? ?Psychiatric Specialty Exam: ? ?Presentation  ?General Appearance: Appropriate for Environment ? ?Eye Contact:Good ? ?Speech:Clear and Coherent ? ?Speech Volume:Normal ? ?Handedness:No data recorded ? ?Mood and Affect  ?Mood:good ?Affect:bright ? ?Thought Process  ?Thought  Processes:Disorganized ? ?Descriptions of Associations:Loose ? ?Orientation:wnl ?Thought Content:Delusions ? ?History of Schizophrenia/Schizoaffective disorder:No ? ?Duration of Psychotic Symptoms:Greater than six months ? ?Sensorium  ?Memory:Immediate Fair ? ?Judgment:Impaired ? ?Insight:Poor ? ? ?Executive Functions  ?Concentration:good ?Attention Span:Fair ? ?Recall:Fair ? ?[place] ? ?Language:Fair ? ? ?Psychomotor Activity  ?Psychomotor Activity:No data recorded ? ?Assets  ?Assets:Resilience ? ? ?Sleep  ?Sleep:No data recorded ? ? ?Physical Exam: ?Physical Exam ?Vitals and nursing note reviewed.  ?Constitutional:   ?   Appearance: Normal appearance.  ?HENT:  ?   Head: Normocephalic and atraumatic.  ?   Mouth/Throat:  ?   Pharynx: Oropharynx is clear.  ?Eyes:  ?   Pupils: Pupils are equal, round, and reactive to light.  ?Cardiovascular:  ?   Rate and Rhythm: Normal rate and regular rhythm.  ?Pulmonary:  ?   Effort: Pulmonary effort is normal.  ?   Breath sounds: Normal breath sounds.  ?Abdominal:  ?   General: Abdomen is flat.  ?   Palpations: Abdomen is soft.  ?Musculoskeletal:     ?   General: Normal range of motion.  ?Skin: ?   General: Skin is warm and dry.  ?Neurological:  ?   General: No focal deficit present.  ?   Mental Status: He is alert. Mental status is at baseline.  ?Psychiatric:     ?   Attention and Perception: Attention normal.     ?   Mood and Affect: Mood normal.     ?   Speech: Speech normal.     ?   Behavior: Behavior normal.     ?   Thought Content: Thought content normal.     ?   Cognition and Memory: Cognition normal.     ?   Judgment: Judgment normal.  ? ?Review of Systems  ?Constitutional: Negative.   ?HENT: Negative.    ?Eyes: Negative.   ?Respiratory: Negative.    ?Cardiovascular: Negative.   ?Gastrointestinal: Negative.   ?Musculoskeletal: Negative.   ?Skin: Negative.   ?Neurological: Negative.   ?Psychiatric/Behavioral: Negative.    ?Blood pressure 97/71, pulse 70,  temperature 98 ?F (36.7 ?C), temperature source Oral, resp. rate 18, height 6' (1.829 m), weight 61.2 kg, SpO2 100 %. Body mass index is 18.31 kg/m?. ? ? ?Treatment Plan Summary: ?Medication management and Plan no change to medication.  Informed patient that we would recommend his staying through the weekend to go to the shelter and he agrees with that.  No other complaints from him.  No change to treatment plan.  Encourage group attendance and participation. ? ?4/22 ?No changes ? ?Rulon Sera, MD ?07/12/2021, 9:20 AM ? ?

## 2021-07-13 DIAGNOSIS — F203 Undifferentiated schizophrenia: Secondary | ICD-10-CM | POA: Diagnosis not present

## 2021-07-13 NOTE — Progress Notes (Signed)
Patient calm and cooperative during assessment denying SI/HI/AVH. Pt observed interacting appropriately with staff and peers on the unit. Pt didn't have any medications scheduled tonight and hasn't requested anything PRN. Pt given education, support, and encouragement to be active in his treatment plan. Pt being monitored Q 15 minutes for safety per unit protocol. Pt remains safe on the unit.  ?

## 2021-07-13 NOTE — Group Note (Addendum)
[facility] LCSW Group Therapy Note ? ? ?Group Date: 07/13/2021 ?Start Time: 1330 ?End Time: 1430 ? ? ?Type of Therapy and Topic: Group Therapy: Avoiding Self-Sabotaging and Enabling Behaviors ? ?Participation Level: Active ? ?Description of Group:  ?In this group, patients will learn how to identify obstacles, self-sabotaging and enabling behaviors, as well as: what are they, why do we do them and what needs these behaviors meet. Discuss unhealthy relationships and how to have positive healthy boundaries with those that sabotage and enable. Explore aspects of self-sabotage and enabling in yourself and how to limit these self-destructive behaviors in everyday life. ? ? ?Therapeutic Goals: ?1. Patient will identify one obstacle that relates to self-sabotage and enabling behaviors ?2. Patient will identify one personal self-sabotaging or enabling behavior they did prior to admission ?3. Patient will state a plan to change the above identified behavior ?4. Patient will demonstrate ability to communicate their needs through discussion and/or role play.  ? ? ?Summary of Patient Progress: Patient presented on time for group and was present for the duration of the session. Patient identified using drugs due to feeling emotional pain as a self sabotaging behavior. Patient shared about how the passing of his mother continues to effect him. Patient shared he was attending 12-step AA meetings as a means of support prior to being admitted. ? ? ? ? ?Therapeutic Modalities:  ?Cognitive Behavioral Therapy ?Person-Centered Therapy ?Motivational Interviewing ? ? ? ?Ileana Ladd Shelbe Haglund, LCSWA ?

## 2021-07-13 NOTE — Plan of Care (Signed)
Pt denies depression, anxiety, SI, HI and AVH. Pt was educated on care plan and verbalizes understanding. Torrie Mayers RN ?Problem: Education: ?Goal: Knowledge of [facility] Education information/materials will improve ?Outcome: Progressing ?Goal: Emotional status will improve ?Outcome: Progressing ?Goal: Mental status will improve ?Outcome: Progressing ?Goal: Verbalization of understanding the information provided will improve ?Outcome: Progressing ?  ?Problem: Safety: ?Goal: Periods of time without injury will increase ?Outcome: Progressing ?  ?Problem: Education: ?Goal: Ability to make informed decisions regarding treatment will improve ?Outcome: Progressing ?  ?Problem: Coping: ?Goal: Coping ability will improve ?Outcome: Progressing ?  ?Problem: Self-Concept: ?Goal: Ability to disclose and discuss suicidal ideas will improve ?Outcome: Progressing ?Goal: Will verbalize positive feelings about self ?Outcome: Progressing ?  ?Problem: Education: ?Goal: Will be free of psychotic symptoms ?Outcome: Progressing ?Goal: Knowledge of the prescribed therapeutic regimen will improve ?Outcome: Progressing ?  ?Problem: Role Relationship: ?Goal: Ability to interact with others will improve ?Outcome: Progressing ?  ?

## 2021-07-13 NOTE — Progress Notes (Signed)
Pt has been calm, cooperative and med compliant. Pt also attended group, has a great appetite and pleasant affect. He is looking forward to discharge. Torrie Mayers RN ?

## 2021-07-13 NOTE — Progress Notes (Signed)

## 2021-07-13 NOTE — Progress Notes (Signed)
[facility] ? ?07/13/2021 9:19 AM ?Seth Fuentes  ?MRN:  [id number] ?Subjective:  ?Patient was initially seen for psychiatric evaluation on July 08, 2021.  He was found to be acutely psychotic and aggressive.  Per report, patient has improved since his hospitalization. ? ?4/23 ?Patient reports doing well.  No significant events in the past 24 hours.  "I am just waiting until tomorrow," patient remarks.  Patient has been calm and cooperative, takes his medication without resistance.  He is eating and sleeping well.  Talks about wanting to go to AA meetings after discharge here, "they give me peace of mind."  Also expresses a desire to have a counselor that he can speak to on an outpatient, regular basis.  Denies symptoms of psychosis.  Denies suicidal or homicidal ideations. ? ?4/22 ?Today, patient expresses himself well.  He is calm appropriate and respectful.  Indicates "I feel a lot better" since his admission to the hospital.  Denies depression or anxiety.  Notes the resolution of hallucinations.  No paranoia.  No new medical issues. ? ?He questions whether he can get disability for his schizophrenia. ? ?No issues. ? ?Principal Problem: Schizophrenia ([facility]) ?Diagnosis: Principal Problem: ?  Schizophrenia ([facility]) ?Active Problems: ?  Substance induced mood disorder ([facility]) ? ?Total Time spent with patient: 26 minutes ? ?Past Psychiatric History: Past history of psychotic symptoms often in the context of substance abuse ? ?Past Medical History: History reviewed. No pertinent past medical history. History reviewed. No pertinent surgical history. ?Family History: History reviewed. No pertinent family history. ?Family Psychiatric  History: See previous ?Social History:  ?Social History  ? ?Substance and Sexual Activity  ?Alcohol Use Not Currently  ?   ?Social History  ? ?Substance and Sexual Activity  ?Drug Use Not Currently  ?  ?Social History  ? ?Socioeconomic History  ? Marital status: Single  ?  Spouse name: Not  on file  ? Number of children: Not on file  ? Years of education: Not on file  ? Highest education level: Not on file  ?Occupational History  ? Not on file  ?Tobacco Use  ? Smoking status: Unknown  ? Smokeless tobacco: Not on file  ?Substance and Sexual Activity  ? Alcohol use: Not Currently  ? Drug use: Not Currently  ? Sexual activity: Not on file  ?Other Topics Concern  ? Not on file  ?Social History Narrative  ? Not on file  ? ?Social Determinants of Health  ? ?Financial Resource Strain: Not on file  ?Food Insecurity: Not on file  ?Transportation Needs: Not on file  ?Physical Activity: Not on file  ?Stress: Not on file  ?Social Connections: Not on file  ? ?Additional Social History:  ?  ?  ?  ?  ?  ?  ?  ?  ?  ?  ?  ? ?Sleep: Fair ? ?Appetite:  Fair ? ?Current Medications: ?Current Facility-Administered Medications  ?Medication Dose Route Frequency Provider Last Rate Last Admin  ? acetaminophen (TYLENOL) tablet 650 mg  650 mg Oral Q6H PRN Clapacs, John T, MD      ? alum & mag hydroxide-simeth (MAALOX/MYLANTA) 200-200-20 MG/5ML suspension 30 mL  30 mL Oral Q4H PRN Clapacs, John T, MD      ? benztropine (COGENTIN) tablet 1 mg  1 mg Oral BID Clapacs, Jackquline DenmarkJohn T, MD   1 mg at 07/13/21 [id number]  ? haloperidol (HALDOL) tablet 5 mg  5 mg Oral BID Clapacs, Jackquline DenmarkJohn T, MD  5 mg at 07/13/21 [id number]  ? hydrOXYzine (ATARAX) tablet 50 mg  50 mg Oral TID PRN Clapacs, Jackquline Denmark, MD      ? magnesium hydroxide (MILK OF MAGNESIA) suspension 30 mL  30 mL Oral Daily PRN Clapacs, John T, MD      ? ? ?Lab Results: No results found for this or any previous visit (from the past 48 hour(s)). ? ?Blood Alcohol level:  ?Lab Results  ?Component Value Date  ? ETH <10 07/06/2021  ? ETH <10 12/22/2019  ? ? ?Metabolic Disorder Labs: ?Lab Results  ?Component Value Date  ? HGBA1C 5.4 07/09/2021  ? MPG 108.28 07/09/2021  ? ?No results found for: PROLACTIN ?Lab Results  ?Component Value Date  ? CHOL 162 07/09/2021  ? TRIG 91 07/09/2021  ? HDL 63 07/09/2021  ?  CHOLHDL 2.6 07/09/2021  ? VLDL 18 07/09/2021  ? LDLCALC 81 07/09/2021  ? ? ?Physical Findings: ?AIMS:  , ,  ,  ,    ?CIWA:    ?COWS:    ? ?Musculoskeletal: ?Strength & Muscle Tone: within normal limits ?Gait & Station: normal ?Patient leans: N/A ? ?Psychiatric Specialty Exam: ? ?Presentation  ?General Appearance: Appropriate for Environment ? ?Eye Contact:Good ? ?Speech:Clear and Coherent ? ?Speech Volume:Normal ? ?Handedness:No data recorded ? ?Mood and Affect  ?Mood:good ?Affect:Full ? ?Thought Process  ?Thought Processes:Disorganized ? ?Descriptions of Associations:Loose ? ?Orientation:wnl ?Thought Content:Delusions ? ?History of Schizophrenia/Schizoaffective disorder:No ? ?Duration of Psychotic Symptoms:Greater than six months ? ?Sensorium  ?Memory:Immediate Fair ? ?Judgment:Impaired ? ?Insight:Poor ? ? ?Executive Functions  ?Concentration:good ?Attention Span:Fair ? ?Recall:Fair ? ?Fund of Knowledge:Fair ? ?Language:Fair ? ? ?Psychomotor Activity  ?Psychomotor Activity:No data recorded ? ?Assets  ?Assets:Resilience ? ? ?Sleep  ?Sleep:No data recorded ? ? ?Physical Exam: ?Physical Exam ?Vitals and nursing note reviewed.  ?Constitutional:   ?   Appearance: Normal appearance.  ?HENT:  ?   Head: Normocephalic and atraumatic.  ?   Mouth/Throat:  ?   Pharynx: Oropharynx is clear.  ?Eyes:  ?   Pupils: Pupils are equal, round, and reactive to light.  ?Cardiovascular:  ?   Rate and Rhythm: Normal rate and regular rhythm.  ?Pulmonary:  ?   Effort: Pulmonary effort is normal.  ?   Breath sounds: Normal breath sounds.  ?Abdominal:  ?   General: Abdomen is flat.  ?   Palpations: Abdomen is soft.  ?Musculoskeletal:     ?   General: Normal range of motion.  ?Skin: ?   General: Skin is warm and dry.  ?Neurological:  ?   General: No focal deficit present.  ?   Mental Status: He is alert. Mental status is at baseline.  ?Psychiatric:     ?   Attention and Perception: Attention normal.     ?   Mood and Affect: Mood normal.     ?    Speech: Speech normal.     ?   Behavior: Behavior normal.     ?   Thought Content: Thought content normal.     ?   Cognition and Memory: Cognition normal.     ?   Judgment: Judgment normal.  ? ?Review of Systems  ?Constitutional: Negative.   ?HENT: Negative.    ?Eyes: Negative.   ?Respiratory: Negative.    ?Cardiovascular: Negative.   ?Gastrointestinal: Negative.   ?Musculoskeletal: Negative.   ?Skin: Negative.   ?Neurological: Negative.   ?Psychiatric/Behavioral: Negative.    ?Blood pressure 105/68, pulse 76, temperature 98.3 ?F (  36.8 ?C), temperature source Oral, resp. rate 18, height 6' (1.829 m), weight 61.2 kg, SpO2 100 %. Body mass index is 18.31 kg/m?. ? ? ?Treatment Plan Summary: ?Medication management and Plan no change to medication.  Informed patient that we would recommend his staying through the weekend to go to the shelter and he agrees with that.  No other complaints from him.  No change to treatment plan.  Encourage group attendance and participation. ? ?4/22 ?No changes ? ?4/23 ?No changes ? ?Reggie Pile, MD ?07/13/2021, 9:19 AM ? ?

## 2021-07-14 DIAGNOSIS — F203 Undifferentiated schizophrenia: Secondary | ICD-10-CM | POA: Diagnosis not present

## 2021-07-14 MED ORDER — HALOPERIDOL 5 MG PO TABS
5.0000 mg | ORAL_TABLET | Freq: Two times a day (BID) | ORAL | 1 refills | Status: AC
Start: 2021-07-14 — End: ?

## 2021-07-14 MED ORDER — BENZTROPINE MESYLATE 1 MG PO TABS: 1.0000 mg | ORAL_TABLET | Freq: Two times a day (BID) | ORAL | 1 refills | Status: AC

## 2021-07-14 MED ORDER — HYDROXYZINE HCL 50 MG PO TABS: 50.0000 mg | ORAL_TABLET | Freq: Three times a day (TID) | ORAL | 1 refills | Status: AC | PRN

## 2021-07-14 NOTE — Group Note (Signed)
[facility] LCSW Group Therapy Note ? ? ? ?Group Date: 07/14/2021 ?Start Time: 1300 ?End Time: 1400 ? ?Type of Therapy and Topic:  Group Therapy:  Overcoming Obstacles ? ?Participation Level:  [facility] PARTICIPATION LEVEL: Did Not Attend ? ?Mood: ? ?Description of Group:   ?In this group patients will be encouraged to explore what they see as obstacles to their own wellness and recovery. They will be guided to discuss their thoughts, feelings, and behaviors related to these obstacles. The group will process together ways to cope with barriers, with attention given to specific choices patients can make. Each patient will be challenged to identify changes they are motivated to make in order to overcome their obstacles. This group will be process-oriented, with patients participating in exploration of their own experiences as well as giving and receiving support and challenge from other group members. ? ?Therapeutic Goals: ?1. Patient will identify personal and current obstacles as they relate to admission. ?2. Patient will identify barriers that currently interfere with their wellness or overcoming obstacles.  ?3. Patient will identify feelings, thought process and behaviors related to these barriers. ?4. Patient will identify two changes they are willing to make to overcome these obstacles:  ? ? ?Summary of Patient Progress ? ? ?Patient did not attend group despite encouraged participation.  ? ? ?Therapeutic Modalities:   ?Cognitive Behavioral Therapy ?Solution Focused Therapy ?Motivational Interviewing ?Relapse Prevention Therapy ? ? ?Loy Little W Jilliam Bellmore, LCSWA ?

## 2021-07-14 NOTE — [facility] Suicide Risk Assessment (Signed)
[facility] Discharge Suicide Risk Assessment ? ? ?Principal Problem: Schizophrenia (HCC) ?Discharge Diagnoses: Principal Problem: ?  Schizophrenia (HCC) ?Active Problems: ?  Substance induced mood disorder (HCC) ? ? ?Total Time spent with patient: 30 minutes ? ?Musculoskeletal: ?Strength & Muscle Tone: within normal limits ?Gait & Station: normal ?Patient leans: N/A ? ?Psychiatric Specialty Exam ? ?Presentation  ?General Appearance: Appropriate for Environment ? ?Eye Contact:Good ? ?Speech:Clear and Coherent ? ?Speech Volume:Normal ? ?Handedness:No data recorded ? ?Mood and Affect  ?Mood:Dysphoric ? ?Duration of Depression Symptoms: No data recorded ?Affect:Blunt; Congruent ? ? ?Thought Process  ?Thought Processes:Disorganized ? ?Descriptions of Associations:Loose ? ?Orientation:Partial ? ?Thought Content:Delusions ? ?History of Schizophrenia/Schizoaffective disorder:No ? ?Duration of Psychotic Symptoms:Greater than six months ? ?Hallucinations:No data recorded ?Ideas of Reference:Delusions; Paranoia ? ?Suicidal Thoughts:No data recorded ?Homicidal Thoughts:No data recorded ? ?Sensorium  ?Memory:Immediate Fair ? ?Judgment:Impaired ? ?Insight:Poor ? ? ?Executive Functions  ?Concentration:Fair ? ?Attention Span:Fair ? ?Recall:Fair ? ?Fund of Knowledge:Fair ? ?Language:Fair ? ? ?Psychomotor Activity  ?Psychomotor Activity:No data recorded ? ?Assets  ?Assets:Resilience ? ? ?Sleep  ?Sleep:No data recorded ? ?Physical Exam: ?Physical Exam ?Vitals and nursing note reviewed.  ?Constitutional:   ?   Appearance: Normal appearance.  ?HENT:  ?   Head: Normocephalic and atraumatic.  ?   Mouth/Throat:  ?   Pharynx: Oropharynx is clear.  ?Eyes:  ?   Pupils: Pupils are equal, round, and reactive to light.  ?Cardiovascular:  ?   Rate and Rhythm: Normal rate and regular rhythm.  ?Pulmonary:  ?   Effort: Pulmonary effort is normal.  ?   Breath sounds: Normal breath sounds.  ?Abdominal:  ?   General: Abdomen is flat.  ?   Palpations: Abdomen  is soft.  ?Musculoskeletal:     ?   General: Normal range of motion.  ?Skin: ?   General: Skin is warm and dry.  ?Neurological:  ?   General: No focal deficit present.  ?   Mental Status: He is alert. Mental status is at baseline.  ?Psychiatric:     ?   Attention and Perception: Attention normal.     ?   Mood and Affect: Mood normal.     ?   Speech: Speech normal.     ?   Behavior: Behavior normal.     ?   Thought Content: Thought content normal.     ?   Cognition and Memory: Cognition normal.     ?   Judgment: Judgment normal.  ? ?Review of Systems  ?Constitutional: Negative.   ?HENT: Negative.    ?Eyes: Negative.   ?Respiratory: Negative.    ?Cardiovascular: Negative.   ?Gastrointestinal: Negative.   ?Musculoskeletal: Negative.   ?Skin: Negative.   ?Neurological: Negative.   ?Psychiatric/Behavioral: Negative.    ?Blood pressure 100/82, pulse 73, temperature 98.2 ?F (36.8 ?C), temperature source Oral, resp. rate 18, height 6' (1.829 m), weight 61.2 kg, SpO2 100 %. Body mass index is 18.31 kg/m?. ? ?Mental Status Per Nursing Assessment::   ?On Admission:  NA ? ?Demographic Factors:  ?Male and Living alone ? ?Loss Factors: ?Legal issues ? ?Historical Factors: ?Impulsivity ? ?Risk Reduction Factors:   ?Positive therapeutic relationship ? ?Continued Clinical Symptoms:  ?Alcohol/Substance Abuse/Dependencies ?Schizophrenia:   Paranoid or undifferentiated type ? ?Cognitive Features That Contribute To Risk:  ?None   ? ?Suicide Risk:  ?Minimal: No identifiable suicidal ideation.  Patients presenting with no risk factors but with morbid ruminations; may be classified as minimal risk based on  the severity of the depressive symptoms ? ? Follow-up Information   ? ? [facility] Follow up.   ?Why: Lorella Nimrod will pick you up for your assessment on Wed at [facility]. ?Contact information: ?[address] ?[place] [address] ?[phone] ? ? ?  ?  ? ?  ?  ? ?  ? ? ?Plan Of Care/Follow-up recommendations:  ?[facility]-up  with[facility] and continue current medicine while avoiding abuse of drugs.  Patient upbeat lucid pleasant and completely denies any suicidal thought. ? ?Mordecai Rasmussen, MD ?07/14/2021, 1:36 PM ?

## 2021-07-14 NOTE — Plan of Care (Signed)
?  Problem: Education: ?Goal: Knowledge of [place] [facility] Education information/materials will improve ?Outcome: Progressing ?Goal: Mental status will improve ?Outcome: Progressing ?Goal: Verbalization of understanding the information provided will improve ?Outcome: Progressing ?  ?Problem: Safety: ?Goal: Periods of time without injury will increase ?Outcome: Progressing ?  ?Problem: Education: ?Goal: Ability to make informed decisions regarding treatment will improve ?Outcome: Progressing ?  ?Problem: Self-Concept: ?Goal: Ability to disclose and discuss suicidal ideas will improve ?Outcome: Progressing ?  ?Problem: Education: ?Goal: Will be free of psychotic symptoms ?Outcome: Progressing ?Goal: Knowledge of the prescribed therapeutic regimen will improve ?Outcome: Progressing ?  ?Problem: Role Relationship: ?Goal: Ability to interact with others will improve ?Outcome: Progressing ?  ?

## 2021-07-14 NOTE — Progress Notes (Signed)
?  [facility] Adult Case Management Discharge Plan : ? ?Will you be returning to the same living situation after discharge:  No. Patient discharged to present to shelter, per conversation with shelter director on 07/11/21 bed was to be available on this day. CSW team has attempted to reach shelter director 3x to confirm, no response.  ? ?At discharge, do you have transportation home?: Yes,  Transportation has been planned. Patient will be using the bus for transportation home. Link bus is free to ride; has been provided with bus schedule/map. Patient has expressed they are confident in navigating bus system.  ? ?Do you have the ability to pay for your medications: No. Patient has no listed insurance, to be provided with 7 day supply of medication at discharge. CSW has provided patient with clinic information for free/reduced medications.  ? ?Release of information consent forms completed and in the chart;  Patient's signature needed at discharge. ? ?Patient to Follow up at: ? Follow-up Information   ? ? [facility] Follow up.   ?Why: Lorella Nimrod will pick you up for your assessment on Wed at [facility]. ?Contact information: ?[address] ?[place] [address] ?[phone] ? ? ?  ?  ? ?  ?  ? ?  ? ? ?Next level of care provider has access to [facility] Link:no ? ?Safety Planning and Suicide Prevention discussed: Yes,  SPE completed with patient , declined consent for CSW to reach collateral.  ? ?Have you used any form of tobacco in the last 30 days? (Cigarettes, Smokeless Tobacco, Cigars, and/or Pipes): No ? ?Has patient been referred to the Quitline?: N/A patient is not a smoker ?Tobacco Use: Not on file  ? ?Patient has been referred for addiction treatment: Yes Patient denies active substance use, UDS positive for cannabis, Patient has been referred to outpatient mental health and substance use assessment at [facility], see above appointment.  ? ?Corky Crafts, LCSWA ?07/14/2021, 1:28 PM ?

## 2021-07-14 NOTE — Discharge Summary (Signed)
Physician Discharge Summary Note ? ?Patient:  Seth Fuentes is an 31 y.o., male ?MRN:  [id number] ?DOB:  09/06/1990 ?Patient phone:  [phone] (home)  ?Patient address:   ?[address] ?[place] [id number],  ?Total Time spent with patient: 30 minutes ? ?Date of Admission:  07/08/2021 ?Date of Discharge: 07/14/2021 ? ?Reason for Admission: Patient was admitted to the hospital with combination of psychotic symptoms and substance abuse with disorganized behavior and paranoia ? ?Principal Problem: Schizophrenia (HCC) ?Discharge Diagnoses: Principal Problem: ?  Schizophrenia (HCC) ?Active Problems: ?  Substance induced mood disorder (HCC) ? ? ?Past Psychiatric History: Past history of schizophrenia and chronic substance abuse issues ? ?Past Medical History: History reviewed. No pertinent past medical history. History reviewed. No pertinent surgical history. ?Family History: History reviewed. No pertinent family history. ?Family Psychiatric  History: See previous ?Social History:  ?Social History  ? ?Substance and Sexual Activity  ?Alcohol Use Not Currently  ?   ?Social History  ? ?Substance and Sexual Activity  ?Drug Use Not Currently  ?  ?Social History  ? ?Socioeconomic History  ? Marital status: Single  ?  Spouse name: Not on file  ? Number of children: Not on file  ? Years of education: Not on file  ? Highest education level: Not on file  ?Occupational History  ? Not on file  ?Tobacco Use  ? Smoking status: Unknown  ? Smokeless tobacco: Not on file  ?Substance and Sexual Activity  ? Alcohol use: Not Currently  ? Drug use: Not Currently  ? Sexual activity: Not on file  ?Other Topics Concern  ? Not on file  ?Social History Narrative  ? Not on file  ? ?Social Determinants of Health  ? ?Financial Resource Strain: Not on file  ?Food Insecurity: Not on file  ?Transportation Needs: Not on file  ?Physical Activity: Not on file  ?Stress: Not on file  ?Social Connections: Not on file  ? ? ?Hospital Course: Patient was  admitted to the hospital and restarted on Haldol as primary medicine for psychosis.  Initially confused and disorganized his mental state improved dramatically over a couple days.  He is now very clear in his thinking and able to converse lucidly and appropriately.  Denies suicidal or homicidal ideation.  Denies hallucinations.  He had been waiting here to try to find a homeless shelter bed but the homeless shelter has not returned our phone calls in several days and as a result the patient is now requesting to simply be discharged with plans to go to [place].  He will be following up with local mental health agencies wherever he is going and is given a supply of medicine as well as prescriptions ? ?Physical Findings: ?AIMS:  , ,  ,  ,    ?CIWA:    ?COWS:    ? ?Musculoskeletal: ?Strength & Muscle Tone: within normal limits ?Gait & Station: normal ?Patient leans: N/A ? ? ?Psychiatric Specialty Exam: ? ?Presentation  ?General Appearance: Appropriate for Environment ? ?Eye Contact:Good ? ?Speech:Clear and Coherent ? ?Speech Volume:Normal ? ?Handedness:No data recorded ? ?Mood and Affect  ?Mood:Dysphoric ? ?Affect:Blunt; Congruent ? ? ?Thought Process  ?Thought Processes:Disorganized ? ?Descriptions of Associations:Loose ? ?Orientation:Partial ? ?Thought Content:Delusions ? ?History of Schizophrenia/Schizoaffective disorder:No ? ?Duration of Psychotic Symptoms:Greater than six months ? ?Hallucinations:No data recorded ?Ideas of Reference:Delusions; Paranoia ? ?Suicidal Thoughts:No data recorded ?Homicidal Thoughts:No data recorded ? ?Sensorium  ?Memory:Immediate Fair ? ?Judgment:Impaired ? ?Insight:Poor ? ? ?Executive Functions  ?Concentration:Fair ? ?Attention  Span:Fair ? ?Recall:Fair ? ?Fund of Knowledge:Fair ? ?Language:Fair ? ? ?Psychomotor Activity  ?Psychomotor Activity:No data recorded ? ?Assets  ?Assets:Resilience ? ? ?Sleep  ?Sleep:No data recorded ? ? ?Physical Exam: ?Physical Exam ?Vitals and nursing note  reviewed.  ?Constitutional:   ?   Appearance: Normal appearance.  ?HENT:  ?   Head: Normocephalic and atraumatic.  ?   Mouth/Throat:  ?   Pharynx: Oropharynx is clear.  ?Eyes:  ?   Pupils: Pupils are equal, round, and reactive to light.  ?Cardiovascular:  ?   Rate and Rhythm: Normal rate and regular rhythm.  ?Pulmonary:  ?   Effort: Pulmonary effort is normal.  ?   Breath sounds: Normal breath sounds.  ?Abdominal:  ?   General: Abdomen is flat.  ?   Palpations: Abdomen is soft.  ?Musculoskeletal:     ?   General: Normal range of motion.  ?Skin: ?   General: Skin is warm and dry.  ?Neurological:  ?   General: No focal deficit present.  ?   Mental Status: He is alert. Mental status is at baseline.  ?Psychiatric:     ?   Mood and Affect: Mood normal.     ?   Thought Content: Thought content normal.  ? ?Review of Systems  ?Constitutional: Negative.   ?HENT: Negative.    ?Eyes: Negative.   ?Respiratory: Negative.    ?Cardiovascular: Negative.   ?Gastrointestinal: Negative.   ?Musculoskeletal: Negative.   ?Skin: Negative.   ?Neurological: Negative.   ?Psychiatric/Behavioral: Negative.    ?Blood pressure 100/82, pulse 73, temperature 98.2 ?F (36.8 ?C), temperature source Oral, resp. rate 18, height 6' (1.829 m), weight 61.2 kg, SpO2 100 %. Body mass index is 18.31 kg/m?. ? ? ?Social History  ? ?Tobacco Use  ?Smoking Status Unknown  ?Smokeless Tobacco Not on file  ? ?Tobacco Cessation:  N/A, patient does not currently use tobacco products ? ? ?Blood Alcohol level:  ?Lab Results  ?Component Value Date  ? ETH <10 07/06/2021  ? ETH <10 12/22/2019  ? ? ?Metabolic Disorder Labs:  ?Lab Results  ?Component Value Date  ? HGBA1C 5.4 07/09/2021  ? MPG 108.28 07/09/2021  ? ?No results found for: PROLACTIN ?Lab Results  ?Component Value Date  ? CHOL 162 07/09/2021  ? TRIG 91 07/09/2021  ? HDL 63 07/09/2021  ? CHOLHDL 2.6 07/09/2021  ? VLDL 18 07/09/2021  ? LDLCALC 81 07/09/2021  ? ? ?See Psychiatric Specialty Exam and Suicide Risk  Assessment completed by Attending Physician prior to discharge. ? ?Discharge destination:  Home ? ?Is patient on multiple antipsychotic therapies at discharge:  No   ?Has Patient had three or more failed trials of antipsychotic monotherapy by history:  No ? ?Recommended Plan for Multiple Antipsychotic Therapies: ?NA ? ?Discharge Instructions   ? ? Diet - low sodium heart healthy   Complete by: As directed ?  ? Increase activity slowly   Complete by: As directed ?  ? ?  ? ?Allergies as of 07/14/2021   ?No Known Allergies ?  ? ?  ?Medication List  ?  ? ?STOP taking these medications   ? ?dicyclomine 20 MG tablet ?Commonly known as: BENTYL ?  ?diphenoxylate-atropine 2.5-0.025 MG tablet ?Commonly known as: Lomotil ?  ?ondansetron 4 MG tablet ?Commonly known as: ZOFRAN ?  ?promethazine 25 MG tablet ?Commonly known as: PHENERGAN ?  ? ?  ? ?TAKE these medications   ? ?  Indication  ?benztropine 1 MG tablet ?Commonly  known as: COGENTIN ?Take 1 tablet (1 mg total) by mouth 2 (two) times daily. ? Indication: Extrapyramidal Reaction caused by Medications ?  ?haloperidol 5 MG tablet ?Commonly known as: HALDOL ?Take 1 tablet (5 mg total) by mouth 2 (two) times daily. ? Indication: Psychosis ?  ?hydrOXYzine 50 MG tablet ?Commonly known as: ATARAX ?Take 1 tablet (50 mg total) by mouth 3 (three) times daily as needed for anxiety. ? Indication: Feeling Anxious ?  ? ?  ? ? Follow-up Information   ? ? [facility] Follow up.   ?Why: Lorella Nimrod will pick you up for your assessment on Wed at [facility]. ?Contact information: ?[address] ?[place] [address] ?[phone] ? ? ?  ?  ? ?  ?  ? ?  ? ? ?Follow-up recommendations: Continue medication as well as sobriety avoiding cocaine and other intoxicating drugs.  Follow-up with local outpatient treatment ? ?Comments: Prescriptions and supply provided ? ?Signed: ?Mordecai Rasmussen, MD ?07/14/2021, 1:39 PM ? ? ? ? ? ? ? ?

## 2021-07-14 NOTE — BH IP Treatment Plan (Signed)
Interdisciplinary Treatment and Diagnostic Plan Update ? ?07/14/2021 ?Time of Session: 0830 ?Seth Fuentes ?MRN: [id number] ? ?Principal Diagnosis: Schizophrenia (HCC) ? ?Secondary Diagnoses: Principal Problem: ?  Schizophrenia (HCC) ?Active Problems: ?  Substance induced mood disorder (HCC) ? ? ?Current Medications:  ?Current Facility-Administered Medications  ?Medication Dose Route Frequency Provider Last Rate Last Admin  ? acetaminophen (TYLENOL) tablet 650 mg  650 mg Oral Q6H PRN Clapacs, John T, MD      ? alum & mag hydroxide-simeth (MAALOX/MYLANTA) 200-200-20 MG/5ML suspension 30 mL  30 mL Oral Q4H PRN Clapacs, John T, MD      ? benztropine (COGENTIN) tablet 1 mg  1 mg Oral BID Clapacs, Jackquline Denmark, MD   1 mg at 07/14/21 0383  ? haloperidol (HALDOL) tablet 5 mg  5 mg Oral BID Clapacs, John T, MD   5 mg at 07/14/21 3383  ? hydrOXYzine (ATARAX) tablet 50 mg  50 mg Oral TID PRN Clapacs, Jackquline Denmark, MD      ? magnesium hydroxide (MILK OF MAGNESIA) suspension 30 mL  30 mL Oral Daily PRN Clapacs, Jackquline Denmark, MD      ? ?PTA Medications: ?Medications Prior to Admission  ?Medication Sig Dispense Refill Last Dose  ? dicyclomine (BENTYL) 20 MG tablet Take 1 tablet (20 mg total) by mouth 2 (two) times daily. (Patient not taking: Reported on 12/23/2019) 10 tablet 0   ? diphenoxylate-atropine (LOMOTIL) 2.5-0.025 MG tablet Take 2 tablets by mouth 4 (four) times daily as needed for diarrhea or loose stools. (Patient not taking: Reported on 12/23/2019) 30 tablet 0   ? ondansetron (ZOFRAN) 4 MG tablet Take 1 tablet (4 mg total) by mouth every 8 (eight) hours as needed for nausea or vomiting. (Patient not taking: Reported on 11/20/2017) 8 tablet 0   ? promethazine (PHENERGAN) 25 MG tablet Take 1 tablet (25 mg total) by mouth every 6 (six) hours as needed for nausea or vomiting. (Patient not taking: Reported on 12/23/2019) 15 tablet 0   ? ? ?Patient Stressors: Financial difficulties   ?Loss of Mother   ?Medication change or noncompliance    ? ?Patient Strengths: General fund of knowledge  ?Religious Affiliation  ?Supportive family/friends  ? ?Treatment Modalities: Medication Management, Group therapy, Case management,  ?1 to 1 session with clinician, Psychoeducation, Recreational therapy. ? ? ?Physician Treatment Plan for Primary Diagnosis: Schizophrenia (HCC) ?Long Term Goal(s): Improvement in symptoms so as ready for discharge  ? ?Short Term Goals: Compliance with prescribed medications will improve ?Ability to identify triggers associated with substance abuse/mental health issues will improve ?Ability to demonstrate self-control will improve ?Ability to identify and develop effective coping behaviors will improve ? ?Medication Management: Evaluate patient's response, side effects, and tolerance of medication regimen. ? ?Therapeutic Interventions: 1 to 1 sessions, Unit Group sessions and Medication administration. ? ?Evaluation of Outcomes: Adequate for Discharge ? ?Physician Treatment Plan for Secondary Diagnosis: Principal Problem: ?  Schizophrenia (HCC) ?Active Problems: ?  Substance induced mood disorder (HCC) ? ?Long Term Goal(s): Improvement in symptoms so as ready for discharge  ? ?Short Term Goals: Compliance with prescribed medications will improve ?Ability to identify triggers associated with substance abuse/mental health issues will improve ?Ability to demonstrate self-control will improve ?Ability to identify and develop effective coping behaviors will improve    ? ?Medication Management: Evaluate patient's response, side effects, and tolerance of medication regimen. ? ?Therapeutic Interventions: 1 to 1 sessions, Unit Group sessions and Medication administration. ? ?Evaluation of Outcomes: Adequate for Discharge ? ? ?  RN Treatment Plan for Primary Diagnosis: Schizophrenia (HCC) ?Long Term Goal(s): Knowledge of disease and therapeutic regimen to maintain health will improve ? ?Short Term Goals: Ability to remain free from injury will  improve, Ability to verbalize frustration and anger appropriately will improve, Ability to demonstrate self-control, Ability to participate in decision making will improve, Ability to verbalize feelings will improve, Ability to disclose and discuss suicidal ideas, and Ability to identify and develop effective coping behaviors will improve ? ?Medication Management: RN will administer medications as ordered by provider, will assess and evaluate patient's response and provide education to patient for prescribed medication. RN will report any adverse and/or side effects to prescribing provider. ? ?Therapeutic Interventions: 1 on 1 counseling sessions, Psychoeducation, Medication administration, Evaluate responses to treatment, Monitor vital signs and CBGs as ordered, Perform/monitor CIWA, COWS, AIMS and Fall Risk screenings as ordered, Perform wound care treatments as ordered. ? ?Evaluation of Outcomes: Adequate for Discharge ? ? ?LCSW Treatment Plan for Primary Diagnosis: Schizophrenia (HCC) ?Long Term Goal(s): Safe transition to appropriate next level of care at discharge, Engage patient in therapeutic group addressing interpersonal concerns. ? ?Short Term Goals: Engage patient in aftercare planning with referrals and resources, Increase social support, Increase ability to appropriately verbalize feelings, Increase emotional regulation, Facilitate acceptance of mental health diagnosis and concerns, Facilitate patient progression through stages of change regarding substance use diagnoses and concerns, Identify triggers associated with mental health/substance abuse issues, and Increase skills for wellness and recovery ? ?Therapeutic Interventions: Assess for all discharge needs, 1 to 1 time with Child psychotherapist, Explore available resources and support systems, Assess for adequacy in community support network, Educate family and significant other(s) on suicide prevention, Complete Psychosocial Assessment, Interpersonal  group therapy. ? ?Evaluation of Outcomes: Adequate for Discharge ? ? ?Progress in Treatment: ?Attending groups: No. ?Participating in groups: No. ?Taking medication as prescribed: Yes. ?Toleration medication: Yes. ?Family/Significant other contact made: No, will contact:  Patient has declined for CSW to reach collateral  ?Patient understands diagnosis: No. ?Discussing patient identified problems/goals with staff: Yes. ?Medical problems stabilized or resolved: Yes. ?Denies suicidal/homicidal ideation: Yes. ?Issues/concerns per patient self-inventory: Yes. ?Other: none ? ?New problem(s) identified: No, Describe:  No additional problems/concerns identified at this time.  ? ?New Short Term/Long Term Goal(s):Patient to work towards detox, elimination of symptoms of psychosis, medication management for mood stabilization; ; development of comprehensive mental wellness/sobriety plan. ? ?Patient Goals:  No additional goals identified at this time. Patient to continue to work towards original goals identified in initial treatment team meeting. CSW will remain available to patient should they voice additional treatment goals.  ? ?Discharge Plan or Barriers: Patient is homeless, plan is for patient to discharge to shelter, shelter director anticipates open bed on this day.  ? ?Reason for Continuation of Hospitalization: N/A ? ?Estimated Length of Stay: Patient anticipated to discharge on this day.  ? ?Last 3 [place] Suicide Severity Risk Score: ?Flowsheet Row Admission (Current) from 07/08/2021 in [facility] ED from [facility]/11/2020 in [facility] from 12/22/2019 in [facility]  ?C-SSRS RISK CATEGORY No Risk Low Risk No Risk  ? ?  ? ? ?Last PHQ 2/9 Scores: ?   ? View : No data to display.  ?  ?  ?  ? ? ?Scribe for Treatment Team: ?Almedia Balls ?07/14/2021 ?1:35 PM ? ? ?

## 2021-07-14 NOTE — Progress Notes (Signed)
Recreation Therapy Notes ? ?Date: 07/14/2021 ? ?Time: 10:45 am   ? ?Location: Courtyard    ? ?Behavioral response: Appropriate ? ?Intervention Topic: Leisure   ? ?Discussion/Intervention:  ?Group content today was focused on leisure. The group defined what leisure is and some positive leisure activities they participate in. Individuals identified the difference between good and bad leisure. Participants expressed how they feel after participating in the leisure of their choice. The group discussed how they go about picking a leisure activity and if others are involved in their leisure activities. The patient stated how many leisure activities they have to choose from and reasons why it is important to have leisure time. Individuals participated in the intervention ?Exploration of Leisure? where they had a chance to identify new leisure activities as well as benefits of leisure. ?Clinical Observations/Feedback: ?Patient came to group and was able to explore participate in many leisure activities. Participant was able to identify leisure activities they enjoy outside of the hospital. Individual was social with peers and staff while participating in the intervention.    ?Deyon Chizek LRT/CTRS  ? ? ? ? ? ? ? ?Seth Fuentes ?07/14/2021 12:25 PM ?

## 2021-07-14 NOTE — Progress Notes (Signed)
Patient ID: Seth Fuentes, male   DOB: 1991-02-27, 31 y.o.   MRN: [id number] ?Patient denies SI/HI/AVH. Belongings were returned to patient. Discharge instructions  including medication and follow up information were reviewed with patient and understanding was verbalized. Patient was not observed to be in distress at time of discharge. Patient was escorted out with staff to physician parking lot to be transported to bus stop by the shuttle. ?

## 2021-07-14 NOTE — Plan of Care (Signed)
?  Problem: Education: ?Goal: Knowledge of [facility] Education information/materials will improve ?07/14/2021 1342 by Donette Larry, RN ?Outcome: Adequate for Discharge ?07/14/2021 0911 by Donette Larry, RN ?Outcome: Progressing ?Goal: Emotional status will improve ?Outcome: Adequate for Discharge ?Goal: Mental status will improve ?07/14/2021 1342 by Donette Larry, RN ?Outcome: Adequate for Discharge ?07/14/2021 0911 by Donette Larry, RN ?Outcome: Progressing ?Goal: Verbalization of understanding the information provided will improve ?07/14/2021 1342 by Donette Larry, RN ?Outcome: Adequate for Discharge ?07/14/2021 0911 by Donette Larry, RN ?Outcome: Progressing ?  ?Problem: Safety: ?Goal: Periods of time without injury will increase ?07/14/2021 1342 by Donette Larry, RN ?Outcome: Adequate for Discharge ?07/14/2021 0911 by Donette Larry, RN ?Outcome: Progressing ?  ?Problem: Education: ?Goal: Ability to make informed decisions regarding treatment will improve ?07/14/2021 1342 by Donette Larry, RN ?Outcome: Adequate for Discharge ?07/14/2021 0911 by Donette Larry, RN ?Outcome: Progressing ?  ?Problem: Coping: ?Goal: Coping ability will improve ?Outcome: Adequate for Discharge ?  ?Problem: Self-Concept: ?Goal: Ability to disclose and discuss suicidal ideas will improve ?07/14/2021 1342 by Donette Larry, RN ?Outcome: Adequate for Discharge ?07/14/2021 0911 by Donette Larry, RN ?Outcome: Progressing ?Goal: Will verbalize positive feelings about self ?Outcome: Adequate for Discharge ?  ?Problem: Education: ?Goal: Will be free of psychotic symptoms ?07/14/2021 1342 by Donette Larry, RN ?Outcome: Adequate for Discharge ?07/14/2021 0911 by Donette Larry, RN ?Outcome: Progressing ?Goal: Knowledge of the prescribed therapeutic regimen will improve ?07/14/2021 1342 by Donette Larry, RN ?Outcome: Adequate for Discharge ?07/14/2021 0911 by Donette Larry, RN ?Outcome:  Progressing ?  ?Problem: Role Relationship: ?Goal: Ability to interact with others will improve ?07/14/2021 1342 by Donette Larry, RN ?Outcome: Adequate for Discharge ?07/14/2021 0911 by Donette Larry, RN ?Outcome: Progressing ?  ?Problem: Group Participation ?Goal: STG - Patient will engage in groups without prompting or encouragement from LRT x3 group sessions within 5 recreation therapy group sessions ?Description: STG - Patient will engage in groups without prompting or encouragement from LRT x3 group sessions within 5 recreation therapy group sessions ?Outcome: Adequate for Discharge ?  ?

## 2021-07-14 NOTE — [facility] Counselor (Signed)
CSW has attempted several times to contact [facility] to check on availability of bed for patient.  ? ?CSW's texts, calls and voicemails have not been answered or returned.  ? ?Penni Homans, MSW, LCSW ?07/14/2021 1:22 PM  ?

## 2021-07-14 NOTE — Progress Notes (Signed)
Recreation Therapy Notes ? ?INPATIENT RECREATION TR PLAN ? ?Patient Details ?Name: Seth Fuentes ?MRN: [id number] ?DOB: [birth year] ?Today's Date: 07/14/2021 ? ?Rec Therapy Plan ?Is patient appropriate for Therapeutic Recreation?: Yes ?Treatment times per week: at least 3 ?Estimated Length of Stay: 5-7 days ?TR Treatment/Interventions: Group participation (Comment) ? ?Discharge Criteria ?Pt will be discharged from therapy if:: Discharged ?Treatment plan/goals/alternatives discussed and agreed upon by:: Patient/family ? ?Discharge Summary ?Short term goals set: Patient will engage in groups without prompting or encouragement from LRT x3 group sessions within 5 recreation therapy group sessions ?Short term goals met: Complete ?Progress toward goals comments: Groups attended ?Which groups?: Leisure education, Social skills, Wellness ?Reason goals not met: N/A ?Therapeutic equipment acquired: N/A ?Reason patient discharged from therapy: Discharge from hospital ?Pt/family agrees with progress & goals achieved: Yes ?Date patient discharged from therapy: 07/14/21 ? ? ?Tekeyah Santiago ?07/14/2021, 4:13 PM ?

## 2021-07-15 ENCOUNTER — Other Ambulatory Visit: Payer: Self-pay

## 2021-07-15 MED ORDER — BENZTROPINE MESYLATE 1 MG PO TABS
1.0000 mg | ORAL_TABLET | Freq: Two times a day (BID) | ORAL | 1 refills | Status: AC
  Filled 2021-07-15: qty 60, 30d supply, fill #0

## 2021-07-15 MED ORDER — HALOPERIDOL 5 MG PO TABS
5.0000 mg | ORAL_TABLET | Freq: Two times a day (BID) | ORAL | 1 refills | Status: AC
  Filled 2021-07-15: qty 60, 30d supply, fill #0

## 2021-07-15 MED ORDER — HYDROXYZINE HCL 50 MG PO TABS
50.0000 mg | ORAL_TABLET | Freq: Three times a day (TID) | ORAL | 1 refills | Status: AC | PRN
  Filled 2021-07-15: qty 60, 20d supply, fill #0

## 2021-07-16 ENCOUNTER — Other Ambulatory Visit: Payer: Self-pay

## 2021-07-31 ENCOUNTER — Telehealth: Payer: Self-pay | Admitting: Pharmacy Technician

## 2021-07-31 ENCOUNTER — Other Ambulatory Visit: Payer: Self-pay

## 2021-07-31 NOTE — Telephone Encounter (Signed)
Patient only signed DOH Attestation.  Would need to provide current year's household income if PAP medications were needed. ? ?Reginal Wojcicki J. Sadeen Wiegel ?Patient Advocate Specialist ?[facility]  ?

## 2022-02-04 ENCOUNTER — Emergency Department (HOSPITAL_COMMUNITY)
Admission: EM | Admit: 2022-02-04 | Discharge: 2022-02-04 | Disposition: A | Discharge status: Home / Self Care | Payer: Medicaid Other | Attending: Emergency Medicine | Admitting: Emergency Medicine

## 2022-02-04 ENCOUNTER — Other Ambulatory Visit: Payer: Self-pay

## 2022-02-04 ENCOUNTER — Encounter (HOSPITAL_COMMUNITY): Payer: Self-pay | Admitting: Emergency Medicine

## 2022-02-04 DIAGNOSIS — W01198A Fall on same level from slipping, tripping and stumbling with subsequent striking against other object, initial encounter: Secondary | ICD-10-CM | POA: Insufficient documentation

## 2022-02-04 DIAGNOSIS — S01112A Laceration without foreign body of left eyelid and periocular area, initial encounter: Secondary | ICD-10-CM | POA: Insufficient documentation

## 2022-02-04 MED ORDER — LIDOCAINE-EPINEPHRINE (PF) 2 %-1:200000 IJ SOLN
20.0000 mL | Freq: Once | INTRAMUSCULAR | Status: AC
  Administered 2022-02-04: 20 mL
  Filled 2022-02-04: qty 20

## 2022-02-04 NOTE — ED Triage Notes (Signed)
Pt BIB EMS from [facility] with c/o left eyebrow laceration after falling and hitting his head. Etoh on board

## 2022-02-04 NOTE — ED Provider Triage Note (Signed)
  Emergency Medicine Provider Triage Evaluation Note  MRN:  [id number]  Arrival date & time: 02/04/22    Medically screening exam initiated at 6:20 AM.   CC:   Laceration   HPI:  Seth Fuentes is a 31 y.o. year-old male presents to the ED with chief complaint of laceration to left eyebrow.  States he hit the corner of a table.  Denies LOC.  Seems intoxicated.  History provided by patient. ROS:  -As included in HPI PE:   Vitals:   02/04/22 0402  BP: (!) 128/91  Pulse: 92  Resp: 18  Temp: 98.2 F (36.8 C)  SpO2: 98%    Non-toxic appearing No respiratory distress 2cm left eyebrow laceration MDM:  Based on signs and symptoms, trauma/lac from fall is highest on my differential.   Patient was informed that the remainder of the evaluation will be completed by another provider, this initial triage assessment does not replace that evaluation, and the importance of remaining in the ED until their evaluation is complete.    Roxy Horseman, PA-C 02/04/22 [phone]

## 2022-02-04 NOTE — Discharge Instructions (Signed)
The sutures should fall out in 5-7 days.  Follow-up with your doctor if they've not fallen out by then.

## 2022-02-04 NOTE — ED Provider Notes (Signed)
[facility]-EMERGENCY DEPT [facility] Emergency Department Provider Note MRN:  [id number]  Arrival date & time: 02/04/22     Chief Complaint   Laceration   History of Present Illness   Seth Fuentes is a 31 y.o. year-old male presents to the ED with chief complaint of eyebrow laceration.  BIB EMS from [facility] after patient fell and hit his head on the corner of a table.  He has been drinking.  He denies LOC.  He denies any other complaints.  History provided by patient.   Review of Systems  Pertinent positive and negative review of systems noted in HPI.    Physical Exam   Vitals:   02/04/22 0402  BP: (!) 128/91  Pulse: 92  Resp: 18  Temp: 98.2 F (36.8 C)  SpO2: 98%    CONSTITUTIONAL:  intoxicated-appearing, NAD NEURO:  Alert and oriented x 3, CN 3-12 grossly intact EYES:  eyes equal and reactive ENT/NECK:  Supple, no stridor, 2 cm left eyebrow laceration CARDIO:  normal rate, regular rhythm, appears well-perfused  PULM:  No respiratory distress,  GI/GU:  non-distended,  MSK/SPINE:  No gross deformities, no edema, moves all extremities  SKIN:  no rash, atraumatic   *Additional and/or pertinent findings included in MDM below  Diagnostic and Interventional Summary    EKG Interpretation  Date/Time:    Ventricular Rate:    PR Interval:    QRS Duration:   QT Interval:    QTC Calculation:   R Axis:     Text Interpretation:         Labs Reviewed - No data to display  No orders to display    Medications  lidocaine-EPINEPHrine (XYLOCAINE W/EPI) 2 %-1:200000 (PF) injection 20 mL (20 mLs Infiltration Given by Other 02/04/22 0442)     Procedures  /  Critical Care .Marland KitchenLaceration Repair  Date/Time: 02/04/2022 4:47 AM  Performed by: Roxy Horseman, PA-C Authorized by: Roxy Horseman, PA-C   Consent:    Consent obtained:  Verbal   Consent given by:  Patient   Alternatives discussed:  No treatment Universal protocol:    Procedure explained and  questions answered to patient or proxy's satisfaction: yes     Relevant documents present and verified: yes     Test results available: yes     Imaging studies available: yes     Required blood products, implants, devices, and special equipment available: yes     Site/side marked: yes     Immediately prior to procedure, a time out was called: yes     Patient identity confirmed:  Verbally with patient Anesthesia:    Anesthesia method:  Local infiltration   Local anesthetic:  Lidocaine 1% WITH epi Laceration details:    Location:  Face   Face location:  L eyebrow   Length (cm):  2 Exploration:    Contaminated: no   Treatment:    Area cleansed with:  Saline Skin repair:    Repair method:  Sutures   Suture size:  5-0   Wound skin closure material used: vicryl rapide.   Number of sutures:  3 Approximation:    Approximation:  Close Repair type:    Repair type:  Simple Post-procedure details:    Dressing:  Open (no dressing)   Procedure completion:  Tolerated well, no immediate complications   ED Course and Medical Decision Making  I have reviewed the triage vital signs, the nursing notes, and pertinent available records from the EMR.  Social Determinants Affecting Complexity  of Care: Patient has no clinically significant social determinants affecting this chief complaint..   ED Course:    Medical Decision Making Risk Prescription drug management.     Consultants: No consultations were needed in caring for this patient.   Treatment and Plan: Emergency department workup does not suggest an emergent condition requiring admission or immediate intervention beyond  what has been performed at this time. The patient is safe for discharge and has  been instructed to return immediately for worsening symptoms, change in  symptoms or any other concerns    Final Clinical Impressions(s) / ED Diagnoses     ICD-10-CM   1. Laceration of left eyebrow, initial encounter  V37.482L        ED Discharge Orders     None         Discharge Instructions Discussed with and Provided to Patient:     Discharge Instructions      The sutures should fall out in 5-7 days.  Follow-up with your doctor if they've not fallen out by then.       Roxy Horseman, PA-C 02/04/22 0786    Tilden Fossa, MD 02/04/22 [phone]
